# Patient Record
Sex: Female | Born: 1955 | Race: White | Hispanic: No | Smoking: Former smoker
Health system: Southern US, Community
[De-identification: ages and names within clinical notes are randomized; demographics above are authoritative.]

## PROBLEM LIST (undated history)

## (undated) DIAGNOSIS — E119 Type 2 diabetes mellitus without complications: Secondary | ICD-10-CM

## (undated) DIAGNOSIS — J45909 Unspecified asthma, uncomplicated: Secondary | ICD-10-CM

## (undated) DIAGNOSIS — I313 Pericardial effusion (noninflammatory): Secondary | ICD-10-CM

## (undated) DIAGNOSIS — I10 Essential (primary) hypertension: Secondary | ICD-10-CM

## (undated) DIAGNOSIS — I890 Lymphedema, not elsewhere classified: Secondary | ICD-10-CM

## (undated) DIAGNOSIS — Z8719 Personal history of other diseases of the digestive system: Secondary | ICD-10-CM

## (undated) DIAGNOSIS — Z8739 Personal history of other diseases of the musculoskeletal system and connective tissue: Secondary | ICD-10-CM

## (undated) DIAGNOSIS — M797 Fibromyalgia: Secondary | ICD-10-CM

## (undated) DIAGNOSIS — J449 Chronic obstructive pulmonary disease, unspecified: Secondary | ICD-10-CM

## (undated) DIAGNOSIS — I4891 Unspecified atrial fibrillation: Secondary | ICD-10-CM

## (undated) DIAGNOSIS — F329 Major depressive disorder, single episode, unspecified: Secondary | ICD-10-CM

## (undated) DIAGNOSIS — F32A Depression, unspecified: Secondary | ICD-10-CM

## (undated) HISTORY — PX: PARTIAL HYSTERECTOMY: SHX80

## (undated) HISTORY — PX: CHOLECYSTECTOMY: SHX55

## (undated) HISTORY — DX: Unspecified atrial fibrillation: I48.91

## (undated) HISTORY — PX: TOTAL VAGINAL HYSTERECTOMY: SHX2548

## (undated) HISTORY — DX: Personal history of other diseases of the digestive system: Z87.19

## (undated) HISTORY — DX: Unspecified asthma, uncomplicated: J45.909

## (undated) HISTORY — DX: Essential (primary) hypertension: I10

## (undated) HISTORY — PX: ABDOMINAL HYSTERECTOMY: SHX81

## (undated) HISTORY — DX: Personal history of other diseases of the musculoskeletal system and connective tissue: Z87.39

## (undated) HISTORY — DX: Pericardial effusion (noninflammatory): I31.3

## (undated) HISTORY — PX: NISSEN FUNDOPLICATION: SHX2091

---

## 2004-09-23 ENCOUNTER — Inpatient Hospital Stay (HOSPITAL_COMMUNITY): Admission: RE | Admit: 2004-09-23 | Discharge: 2004-09-26 | Payer: Self-pay | Admitting: Gynecology

## 2008-05-01 HISTORY — PX: CARDIAC CATHETERIZATION: SHX172

## 2008-05-01 HISTORY — PX: NASAL SINUS SURGERY: SHX719

## 2009-03-12 ENCOUNTER — Ambulatory Visit: Payer: Self-pay | Admitting: Cardiology

## 2009-12-14 ENCOUNTER — Ambulatory Visit: Payer: Self-pay | Admitting: Internal Medicine

## 2010-02-28 ENCOUNTER — Ambulatory Visit: Payer: Self-pay | Admitting: Gastroenterology

## 2010-03-03 LAB — PATHOLOGY REPORT

## 2010-06-30 ENCOUNTER — Ambulatory Visit: Payer: Self-pay | Admitting: Internal Medicine

## 2011-01-13 ENCOUNTER — Ambulatory Visit: Payer: Self-pay | Admitting: Internal Medicine

## 2011-10-30 DIAGNOSIS — I313 Pericardial effusion (noninflammatory): Secondary | ICD-10-CM

## 2011-10-30 HISTORY — DX: Pericardial effusion (noninflammatory): I31.3

## 2011-11-07 ENCOUNTER — Inpatient Hospital Stay: Payer: Self-pay

## 2011-11-07 DIAGNOSIS — R079 Chest pain, unspecified: Secondary | ICD-10-CM

## 2011-11-07 DIAGNOSIS — I319 Disease of pericardium, unspecified: Secondary | ICD-10-CM

## 2011-11-07 LAB — BASIC METABOLIC PANEL
Calcium, Total: 8.8 mg/dL (ref 8.5–10.1)
Chloride: 102 mmol/L (ref 98–107)
Co2: 29 mmol/L (ref 21–32)
EGFR (Non-African Amer.): >60
Osmolality: 280 (ref 275–301)
Potassium: 3.2 mmol/L — ABNORMAL LOW (ref 3.5–5.1)
Sodium: 139 mmol/L (ref 136–145)

## 2011-11-07 LAB — CK TOTAL AND CKMB (NOT AT ARMC)
CK, Total: 93 U/L (ref 21–215)
CK, Total: 53 U/L (ref 21–215)
CK-MB: <0.5 ng/mL — ABNORMAL LOW (ref 0.5–3.6)

## 2011-11-07 LAB — CBC
HGB: 11.8 g/dL — ABNORMAL LOW (ref 12.0–16.0)
MCHC: 32.9 g/dL (ref 32.0–36.0)
Platelet: 481 10*3/uL — ABNORMAL HIGH (ref 150–440)
RDW: 14.4 % (ref 11.5–14.5)

## 2011-11-07 LAB — MAGNESIUM: Magnesium: 2 mg/dL

## 2011-11-07 LAB — PRO B NATRIURETIC PEPTIDE: B-Type Natriuretic Peptide: 228 pg/mL — ABNORMAL HIGH (ref 0–125)

## 2011-11-07 LAB — TROPONIN I: Troponin-I: <0.02 ng/mL

## 2011-11-08 LAB — COMPREHENSIVE METABOLIC PANEL
BUN: 16 mg/dL (ref 7–18)
Chloride: 98 mmol/L (ref 98–107)
Co2: 34 mmol/L — ABNORMAL HIGH (ref 21–32)
EGFR (African American): >60
SGOT(AST): 121 U/L — ABNORMAL HIGH (ref 15–37)
SGPT (ALT): 109 U/L — ABNORMAL HIGH
Total Protein: 7.4 g/dL (ref 6.4–8.2)

## 2011-11-08 LAB — CBC WITH DIFFERENTIAL/PLATELET
Basophil #: 0.1 10*3/uL (ref 0.0–0.1)
Eosinophil %: 0 %
HCT: 33.7 % — ABNORMAL LOW (ref 35.0–47.0)
Lymphocyte #: 1.8 10*3/uL (ref 1.0–3.6)
MCV: 86 fL (ref 80–100)
Monocyte %: 10.6 %
Neutrophil #: 8.6 10*3/uL — ABNORMAL HIGH (ref 1.4–6.5)
RDW: 14.5 % (ref 11.5–14.5)
WBC: 11.8 10*3/uL — ABNORMAL HIGH (ref 3.6–11.0)

## 2011-11-08 LAB — CK TOTAL AND CKMB (NOT AT ARMC): CK-MB: <0.5 ng/mL — ABNORMAL LOW (ref 0.5–3.6)

## 2011-11-10 LAB — BASIC METABOLIC PANEL
Anion Gap: 8 (ref 7–16)
BUN: 14 mg/dL (ref 7–18)
Chloride: 102 mmol/L (ref 98–107)
Creatinine: 0.83 mg/dL (ref 0.60–1.30)
EGFR (African American): >60
Glucose: 185 mg/dL — ABNORMAL HIGH (ref 65–99)

## 2011-11-10 LAB — CBC WITH DIFFERENTIAL/PLATELET
Basophil %: 0.1 %
Eosinophil #: 0 10*3/uL (ref 0.0–0.7)
Lymphocyte %: 3.8 %
MCHC: 32.2 g/dL (ref 32.0–36.0)
Neutrophil %: 89 %

## 2011-11-18 ENCOUNTER — Emergency Department: Payer: Self-pay | Admitting: *Deleted

## 2011-11-18 LAB — COMPREHENSIVE METABOLIC PANEL
Albumin: 3.1 g/dL — ABNORMAL LOW (ref 3.4–5.0)
Anion Gap: 13 (ref 7–16)
Bilirubin,Total: 1.2 mg/dL — ABNORMAL HIGH (ref 0.2–1.0)
Calcium, Total: 8.7 mg/dL (ref 8.5–10.1)
Glucose: 183 mg/dL — ABNORMAL HIGH (ref 65–99)
Osmolality: 277 (ref 275–301)
Potassium: 4.7 mmol/L (ref 3.5–5.1)
Sodium: 134 mmol/L — ABNORMAL LOW (ref 136–145)
Total Protein: 6.5 g/dL (ref 6.4–8.2)

## 2011-11-18 LAB — CBC
MCH: 26.2 pg (ref 26.0–34.0)
MCHC: 30.8 g/dL — ABNORMAL LOW (ref 32.0–36.0)
Platelet: 748 10*3/uL — ABNORMAL HIGH (ref 150–440)
RDW: 15 % — ABNORMAL HIGH (ref 11.5–14.5)

## 2011-11-20 ENCOUNTER — Inpatient Hospital Stay: Payer: Self-pay | Admitting: Internal Medicine

## 2011-11-20 LAB — BASIC METABOLIC PANEL
Anion Gap: 9 (ref 7–16)
BUN: 13 mg/dL (ref 7–18)
Chloride: 95 mmol/L — ABNORMAL LOW (ref 98–107)
Creatinine: 0.76 mg/dL (ref 0.60–1.30)
EGFR (African American): >60
Glucose: 87 mg/dL (ref 65–99)

## 2011-11-20 LAB — HEPATIC FUNCTION PANEL A (ARMC)
Alkaline Phosphatase: 180 U/L — ABNORMAL HIGH (ref 50–136)
Bilirubin, Direct: 0.2 mg/dL (ref 0.00–0.20)
Bilirubin,Total: 0.7 mg/dL (ref 0.2–1.0)

## 2011-11-20 LAB — AMYLASE: Amylase: 26 U/L (ref 25–115)

## 2011-11-20 LAB — URINALYSIS, COMPLETE
Bacteria: NONE SEEN
Bilirubin,UR: NEGATIVE
Glucose,UR: NEGATIVE mg/dL (ref 0–75)
Ketone: NEGATIVE
Protein: NEGATIVE
RBC,UR: <1 /HPF (ref 0–5)
WBC UR: <1 /HPF (ref 0–5)

## 2011-11-20 LAB — CBC WITH DIFFERENTIAL/PLATELET
Eosinophil %: 0.3 %
HCT: 37 % (ref 35.0–47.0)
Lymphocyte #: 3.1 10*3/uL (ref 1.0–3.6)
MCH: 26.2 pg (ref 26.0–34.0)
MCV: 87 fL (ref 80–100)
Monocyte #: 3.7 x10 3/mm — ABNORMAL HIGH (ref 0.2–0.9)
Monocyte %: 10.8 %
Neutrophil #: 27.5 10*3/uL — ABNORMAL HIGH (ref 1.4–6.5)
Platelet: 577 10*3/uL — ABNORMAL HIGH (ref 150–440)
RBC: 4.27 10*6/uL (ref 3.80–5.20)
WBC: 34.5 10*3/uL — ABNORMAL HIGH (ref 3.6–11.0)

## 2011-11-20 LAB — LIPASE, BLOOD: Lipase: 68 U/L — ABNORMAL LOW (ref 73–393)

## 2011-11-21 DIAGNOSIS — I319 Disease of pericardium, unspecified: Secondary | ICD-10-CM

## 2011-11-21 DIAGNOSIS — R079 Chest pain, unspecified: Secondary | ICD-10-CM

## 2011-11-21 DIAGNOSIS — R0602 Shortness of breath: Secondary | ICD-10-CM

## 2011-11-21 LAB — CBC WITH DIFFERENTIAL/PLATELET
Basophil #: 0 10*3/uL (ref 0.0–0.1)
Basophil %: 0.1 %
Eosinophil %: 0 %
HCT: 32.9 % — ABNORMAL LOW (ref 35.0–47.0)
HGB: 10.3 g/dL — ABNORMAL LOW (ref 12.0–16.0)
Lymphocyte #: 1.2 10*3/uL (ref 1.0–3.6)
Lymphocyte %: 5.1 %
MCV: 85 fL (ref 80–100)
Monocyte %: 8.7 %
Neutrophil #: 20.7 10*3/uL — ABNORMAL HIGH (ref 1.4–6.5)
RBC: 3.88 10*6/uL (ref 3.80–5.20)
RDW: 14.4 % (ref 11.5–14.5)
WBC: 24 10*3/uL — ABNORMAL HIGH (ref 3.6–11.0)

## 2011-11-21 LAB — COMPREHENSIVE METABOLIC PANEL
Albumin: 2.6 g/dL — ABNORMAL LOW (ref 3.4–5.0)
Anion Gap: 8 (ref 7–16)
BUN: 10 mg/dL (ref 7–18)
Bilirubin,Total: 0.6 mg/dL (ref 0.2–1.0)
Creatinine: 0.8 mg/dL (ref 0.60–1.30)
Glucose: 177 mg/dL — ABNORMAL HIGH (ref 65–99)
Osmolality: 274 (ref 275–301)
Potassium: 3.7 mmol/L (ref 3.5–5.1)
Sodium: 135 mmol/L — ABNORMAL LOW (ref 136–145)
Total Protein: 6.6 g/dL (ref 6.4–8.2)

## 2011-11-21 LAB — TSH: Thyroid Stimulating Horm: 0.249 u[IU]/mL — ABNORMAL LOW

## 2011-11-21 LAB — CLOSTRIDIUM DIFFICILE BY PCR

## 2011-11-22 DIAGNOSIS — I300 Acute nonspecific idiopathic pericarditis: Secondary | ICD-10-CM

## 2011-11-22 LAB — IRON AND TIBC
Iron Bind.Cap.(Total): 228 ug/dL — ABNORMAL LOW (ref 250–450)
Iron Saturation: 28 %
Iron: 64 ug/dL (ref 50–170)
Unbound Iron-Bind.Cap.: 164 ug/dL

## 2011-11-22 LAB — BASIC METABOLIC PANEL
BUN: 11 mg/dL (ref 7–18)
Calcium, Total: 8.6 mg/dL (ref 8.5–10.1)
Chloride: 102 mmol/L (ref 98–107)
Co2: 30 mmol/L (ref 21–32)
EGFR (African American): >60
EGFR (Non-African Amer.): >60
Osmolality: 283 (ref 275–301)
Potassium: 4 mmol/L (ref 3.5–5.1)
Sodium: 140 mmol/L (ref 136–145)

## 2011-11-22 LAB — FERRITIN: Ferritin (ARMC): 647 ng/mL — ABNORMAL HIGH (ref 8–388)

## 2011-11-22 LAB — CBC WITH DIFFERENTIAL/PLATELET
Basophil #: 0 10*3/uL (ref 0.0–0.1)
Basophil %: 0.2 %
Eosinophil %: 0 %
HCT: 31.5 % — ABNORMAL LOW (ref 35.0–47.0)
HGB: 9.9 g/dL — ABNORMAL LOW (ref 12.0–16.0)
Lymphocyte #: 1.1 10*3/uL (ref 1.0–3.6)
MCH: 26.8 pg (ref 26.0–34.0)
MCHC: 31.6 g/dL — ABNORMAL LOW (ref 32.0–36.0)
MCV: 85 fL (ref 80–100)
Monocyte #: 0.9 x10 3/mm (ref 0.2–0.9)
Monocyte %: 4.5 %
Neutrophil #: 17.2 10*3/uL — ABNORMAL HIGH (ref 1.4–6.5)
Neutrophil %: 89.5 %
RBC: 3.71 10*6/uL — ABNORMAL LOW (ref 3.80–5.20)
WBC: 19.2 10*3/uL — ABNORMAL HIGH (ref 3.6–11.0)

## 2011-11-22 LAB — PROTEIN, BODY FLUID: Protein, Body Fluid: 3.1 g/dL

## 2011-11-22 LAB — GLUCOSE, SEROUS FLUID: Glucose, Body Fluid: 155 mg/dL

## 2011-11-23 LAB — CBC WITH DIFFERENTIAL/PLATELET
Basophil #: 0 10*3/uL (ref 0.0–0.1)
Eosinophil #: 0 10*3/uL (ref 0.0–0.7)
Eosinophil %: 0 %
HGB: 10 g/dL — ABNORMAL LOW (ref 12.0–16.0)
Lymphocyte #: 1.2 10*3/uL (ref 1.0–3.6)
Lymphocyte %: 9.8 %
MCH: 27.5 pg (ref 26.0–34.0)
MCHC: 32.3 g/dL (ref 32.0–36.0)
MCV: 85 fL (ref 80–100)
Monocyte #: 0.6 x10 3/mm (ref 0.2–0.9)
Monocyte %: 4.7 %
Neutrophil %: 85.4 %
Platelet: 655 10*3/uL — ABNORMAL HIGH (ref 150–440)
RBC: 3.64 10*6/uL — ABNORMAL LOW (ref 3.80–5.20)
RDW: 15 % — ABNORMAL HIGH (ref 11.5–14.5)

## 2011-11-23 LAB — BASIC METABOLIC PANEL
Calcium, Total: 8.9 mg/dL (ref 8.5–10.1)
Chloride: 100 mmol/L (ref 98–107)
Co2: 27 mmol/L (ref 21–32)
Creatinine: 0.85 mg/dL (ref 0.60–1.30)
EGFR (African American): >60
EGFR (Non-African Amer.): >60
Glucose: 192 mg/dL — ABNORMAL HIGH (ref 65–99)
Potassium: 4.3 mmol/L (ref 3.5–5.1)
Sodium: 138 mmol/L (ref 136–145)

## 2011-11-23 LAB — HEPATIC FUNCTION PANEL A (ARMC)
Albumin: 2.5 g/dL — ABNORMAL LOW (ref 3.4–5.0)
Alkaline Phosphatase: 149 U/L — ABNORMAL HIGH (ref 50–136)
Bilirubin, Direct: <0.1 mg/dL (ref 0.00–0.20)
SGOT(AST): 48 U/L — ABNORMAL HIGH (ref 15–37)
Total Protein: 6.7 g/dL (ref 6.4–8.2)

## 2011-11-24 DIAGNOSIS — I4891 Unspecified atrial fibrillation: Secondary | ICD-10-CM

## 2011-11-24 LAB — CBC WITH DIFFERENTIAL/PLATELET
Basophil #: 0 x10 3/mm 3 (ref 0.0–0.1)
Basophil %: 0.1 %
Eosinophil #: 0 x10 3/mm 3 (ref 0.0–0.7)
Eosinophil %: 0 %
HCT: 32.1 % — ABNORMAL LOW (ref 35.0–47.0)
HGB: 10.7 g/dL — ABNORMAL LOW (ref 12.0–16.0)
Lymphocyte %: 10.9 %
Lymphs Abs: 1.4 x10 3/mm 3 (ref 1.0–3.6)
MCH: 27.5 pg (ref 26.0–34.0)
MCHC: 33.3 g/dL (ref 32.0–36.0)
MCV: 83 fL (ref 80–100)
Monocyte #: 0.9 x10 3/mm (ref 0.2–0.9)
Monocyte %: 6.9 %
Neutrophil #: 10.2 x10 3/mm 3 — ABNORMAL HIGH (ref 1.4–6.5)
Neutrophil %: 82.1 %
Platelet: 709 x10 3/mm 3 — ABNORMAL HIGH (ref 150–440)
RBC: 3.88 X10 6/mm 3 (ref 3.80–5.20)
RDW: 14.6 % — ABNORMAL HIGH (ref 11.5–14.5)
WBC: 12.5 x10 3/mm 3 — ABNORMAL HIGH (ref 3.6–11.0)

## 2011-11-24 LAB — BASIC METABOLIC PANEL
Anion Gap: 10 (ref 7–16)
BUN: 21 mg/dL — ABNORMAL HIGH (ref 7–18)
Chloride: 100 mmol/L (ref 98–107)
Creatinine: 0.86 mg/dL (ref 0.60–1.30)
EGFR (African American): >60
EGFR (Non-African Amer.): >60
Glucose: 161 mg/dL — ABNORMAL HIGH (ref 65–99)
Potassium: 4.2 mmol/L (ref 3.5–5.1)

## 2011-11-24 LAB — PROT IMMUNOELECTROPHORES(ARMC)

## 2011-11-25 DIAGNOSIS — I300 Acute nonspecific idiopathic pericarditis: Secondary | ICD-10-CM

## 2011-11-25 LAB — BASIC METABOLIC PANEL
Anion Gap: 13 (ref 7–16)
Chloride: 99 mmol/L (ref 98–107)
Co2: 27 mmol/L (ref 21–32)
EGFR (Non-African Amer.): >60
Glucose: 212 mg/dL — ABNORMAL HIGH (ref 65–99)
Osmolality: 288 (ref 275–301)
Potassium: 4.2 mmol/L (ref 3.5–5.1)

## 2011-11-25 LAB — CBC WITH DIFFERENTIAL/PLATELET
Basophil #: 0 10*3/uL (ref 0.0–0.1)
Basophil %: 0.1 %
HCT: 34.8 % — ABNORMAL LOW (ref 35.0–47.0)
HGB: 11.8 g/dL — ABNORMAL LOW (ref 12.0–16.0)
Lymphocyte %: 8.1 %
MCH: 28.3 pg (ref 26.0–34.0)
Monocyte %: 2.4 %
Neutrophil #: 13.6 10*3/uL — ABNORMAL HIGH (ref 1.4–6.5)
WBC: 15.3 10*3/uL — ABNORMAL HIGH (ref 3.6–11.0)

## 2011-11-25 LAB — CULTURE, BLOOD (SINGLE)

## 2011-11-26 ENCOUNTER — Ambulatory Visit: Payer: Self-pay | Admitting: Internal Medicine

## 2011-11-26 LAB — URINALYSIS, COMPLETE
Bacteria: NONE SEEN
Bilirubin,UR: NEGATIVE
Blood: NEGATIVE
Glucose,UR: NEGATIVE mg/dL (ref 0–75)
Ketone: NEGATIVE
Ph: 6 (ref 4.5–8.0)
RBC,UR: 1 /HPF (ref 0–5)
Specific Gravity: 1.024 (ref 1.003–1.030)
Squamous Epithelial: 2

## 2011-11-26 LAB — BASIC METABOLIC PANEL
Anion Gap: 11 (ref 7–16)
BUN: 27 mg/dL — ABNORMAL HIGH (ref 7–18)
Calcium, Total: 8.5 mg/dL (ref 8.5–10.1)
Chloride: 101 mmol/L (ref 98–107)
Co2: 27 mmol/L (ref 21–32)
Creatinine: 0.68 mg/dL (ref 0.60–1.30)
Osmolality: 287 (ref 275–301)
Potassium: 4.1 mmol/L (ref 3.5–5.1)

## 2011-11-26 LAB — CBC WITH DIFFERENTIAL/PLATELET
Basophil %: 0.2 %
Eosinophil #: 0 10*3/uL (ref 0.0–0.7)
Eosinophil %: 0 %
HCT: 36.6 % (ref 35.0–47.0)
HGB: 12.1 g/dL (ref 12.0–16.0)
Lymphocyte #: 1.6 10*3/uL (ref 1.0–3.6)
MCH: 27.7 pg (ref 26.0–34.0)
Monocyte #: 0.9 x10 3/mm (ref 0.2–0.9)
Monocyte %: 4.4 %
Neutrophil #: 18 10*3/uL — ABNORMAL HIGH (ref 1.4–6.5)
Neutrophil %: 87.8 %
Platelet: 839 10*3/uL — ABNORMAL HIGH (ref 150–440)
RBC: 4.38 10*6/uL (ref 3.80–5.20)
WBC: 20.5 10*3/uL — ABNORMAL HIGH (ref 3.6–11.0)

## 2011-11-26 LAB — CULTURE, BLOOD (SINGLE)

## 2011-11-27 LAB — CBC WITH DIFFERENTIAL/PLATELET
Basophil #: 0 10*3/uL (ref 0.0–0.1)
Basophil %: 0.1 %
Eosinophil %: 0 %
HCT: 36.2 % (ref 35.0–47.0)
Lymphocyte %: 7.5 %
MCHC: 33 g/dL (ref 32.0–36.0)
Monocyte %: 4.4 %
Neutrophil #: 16.2 10*3/uL — ABNORMAL HIGH (ref 1.4–6.5)
Platelet: 783 10*3/uL — ABNORMAL HIGH (ref 150–440)
RBC: 4.3 10*6/uL (ref 3.80–5.20)
RDW: 15.4 % — ABNORMAL HIGH (ref 11.5–14.5)
WBC: 18.4 10*3/uL — ABNORMAL HIGH (ref 3.6–11.0)

## 2011-11-27 LAB — BASIC METABOLIC PANEL
BUN: 26 mg/dL — ABNORMAL HIGH (ref 7–18)
Calcium, Total: 8.5 mg/dL (ref 8.5–10.1)
Chloride: 97 mmol/L — ABNORMAL LOW (ref 98–107)
Co2: 28 mmol/L (ref 21–32)
EGFR (Non-African Amer.): >60
Osmolality: 283 (ref 275–301)
Potassium: 4 mmol/L (ref 3.5–5.1)

## 2011-11-28 ENCOUNTER — Ambulatory Visit: Payer: Self-pay | Admitting: Internal Medicine

## 2011-11-28 LAB — CBC WITH DIFFERENTIAL/PLATELET
Basophil #: 0 10*3/uL (ref 0.0–0.1)
Basophil %: 0.3 %
Eosinophil %: 0 %
HCT: 35.1 % (ref 35.0–47.0)
Lymphocyte #: 1.1 10*3/uL (ref 1.0–3.6)
Lymphocyte %: 6.7 %
MCV: 84 fL (ref 80–100)
Monocyte %: 5.2 %
Neutrophil #: 13.8 10*3/uL — ABNORMAL HIGH (ref 1.4–6.5)
Neutrophil %: 87.8 %
Platelet: 747 10*3/uL — ABNORMAL HIGH (ref 150–440)
RBC: 4.2 10*6/uL (ref 3.80–5.20)
RDW: 15 % — ABNORMAL HIGH (ref 11.5–14.5)
WBC: 15.8 10*3/uL — ABNORMAL HIGH (ref 3.6–11.0)

## 2011-11-28 LAB — COMPREHENSIVE METABOLIC PANEL
Albumin: 2.6 g/dL — ABNORMAL LOW (ref 3.4–5.0)
Anion Gap: 9 (ref 7–16)
BUN: 28 mg/dL — ABNORMAL HIGH (ref 7–18)
Bilirubin,Total: 0.2 mg/dL (ref 0.2–1.0)
Calcium, Total: 8.7 mg/dL (ref 8.5–10.1)
Chloride: 101 mmol/L (ref 98–107)
Creatinine: 0.94 mg/dL (ref 0.60–1.30)
Glucose: 162 mg/dL — ABNORMAL HIGH (ref 65–99)
Osmolality: 287 (ref 275–301)
Potassium: 4.5 mmol/L (ref 3.5–5.1)
Sodium: 139 mmol/L (ref 136–145)
Total Protein: 6.3 g/dL — ABNORMAL LOW (ref 6.4–8.2)

## 2011-11-30 ENCOUNTER — Ambulatory Visit: Payer: Self-pay | Admitting: Internal Medicine

## 2011-12-01 ENCOUNTER — Encounter: Payer: Self-pay | Admitting: Cardiovascular Disease

## 2011-12-01 LAB — CULTURE, BLOOD (SINGLE)

## 2011-12-05 ENCOUNTER — Other Ambulatory Visit: Payer: Self-pay | Admitting: Cardiology

## 2011-12-05 DIAGNOSIS — R002 Palpitations: Secondary | ICD-10-CM

## 2011-12-07 ENCOUNTER — Ambulatory Visit: Payer: Self-pay | Admitting: Internal Medicine

## 2011-12-07 LAB — CBC CANCER CENTER
Basophil %: 0.8 %
Eosinophil %: 0.1 %
HCT: 41.1 % (ref 35.0–47.0)
HGB: 13.7 g/dL (ref 12.0–16.0)
Lymphocyte %: 9.3 %
MCH: 28.2 pg (ref 26.0–34.0)
Neutrophil #: 16 x10 3/mm — ABNORMAL HIGH (ref 1.4–6.5)
Neutrophil %: 83.4 %
RBC: 4.87 10*6/uL (ref 3.80–5.20)
RDW: 16.2 % — ABNORMAL HIGH (ref 11.5–14.5)

## 2011-12-12 ENCOUNTER — Other Ambulatory Visit: Payer: Self-pay

## 2011-12-12 ENCOUNTER — Other Ambulatory Visit (INDEPENDENT_AMBULATORY_CARE_PROVIDER_SITE_OTHER): Payer: Managed Care, Other (non HMO)

## 2011-12-12 DIAGNOSIS — R002 Palpitations: Secondary | ICD-10-CM

## 2011-12-18 ENCOUNTER — Ambulatory Visit: Payer: Self-pay | Admitting: Rheumatology

## 2011-12-19 ENCOUNTER — Ambulatory Visit (INDEPENDENT_AMBULATORY_CARE_PROVIDER_SITE_OTHER): Payer: Managed Care, Other (non HMO) | Admitting: Cardiovascular Disease

## 2011-12-19 ENCOUNTER — Encounter: Payer: Self-pay | Admitting: Cardiovascular Disease

## 2011-12-19 VITALS — BP 160/82 | HR 87 | Ht 63.0 in | Wt 219.2 lb

## 2011-12-19 DIAGNOSIS — I319 Disease of pericardium, unspecified: Secondary | ICD-10-CM

## 2011-12-19 DIAGNOSIS — I313 Pericardial effusion (noninflammatory): Secondary | ICD-10-CM | POA: Insufficient documentation

## 2011-12-19 DIAGNOSIS — Z01810 Encounter for preprocedural cardiovascular examination: Secondary | ICD-10-CM

## 2011-12-19 DIAGNOSIS — I4891 Unspecified atrial fibrillation: Secondary | ICD-10-CM | POA: Insufficient documentation

## 2011-12-19 NOTE — Patient Instructions (Addendum)
Your echo showed no fluid around the heart.  Follow up as needed.

## 2011-12-19 NOTE — Assessment & Plan Note (Signed)
This was brief in the setting of pericardial effusion. She has been in sinus rhythm. Continue treatment with diltiazem given that her blood pressure is elevated.

## 2011-12-19 NOTE — Progress Notes (Signed)
HPI  This is a 56 year old female who is here today for a followup visit. She was hospitalized last month at [facility] with idiopathic moderate size pericardial effusion. She was treated with ibuprofen. Steroids were added before hospital discharge. The patient was discharged home but returned a few days later with worsening symptoms. She was noted to have new left pleural effusion as well as stable pericardial effusion. She was treated with colchicine in addition to the above. She had one brief episode of atrial fibrillation with rapid ventricular response and converted to normal sinus rhythm with diltiazem. Since hospital discharge, the patient reports resolution of her symptoms. She denies chest pain, dyspnea or palpitations. She underwent an echocardiogram which showed no evidence of pericardial effusion with normal ejection fraction. 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 Her symptoms improved after decreasing the dose of Decadron. This is due to be stopped in 2 days.  Allergies  Allergen Reactions  . Azithromycin   . Codeine     Vomiting  . Demerol (Meperidine)     Skin feels like it is burning.  . Latex     Peeling skin  . Nucynta (Tapentadol)     Nausea  . Prednisone     Skin gets red.  . Tramadol     vomiting     Current Outpatient Prescriptions on File Prior to Visit  Medication Sig Dispense Refill  . albuterol (PROVENTIL) (2.5 MG/3ML) 0.083% nebulizer solution Take 2.5 mg by nebulization every 4 (four) hours as needed.      Marland Kitchen aspirin 81 MG tablet Take 81 mg by mouth daily.      Marland Kitchen diltiazem (CARDIZEM CD) 240 MG 24 hr capsule Take 240 mg by mouth daily.      . fluticasone (FLONASE) 50 MCG/ACT nasal spray Place 2 sprays into the nose daily.      . Fluticasone-Salmeterol (ADVAIR DISKUS) 250-50 MCG/DOSE AEPB Inhale 1 puff into the lungs 2 (two) times daily.      . hydrochlorothiazide (HYDRODIURIL) 25 MG tablet Take 25 mg by mouth daily.      .  montelukast (SINGULAIR) 10 MG tablet Take 10 mg by mouth at bedtime.      . pantoprazole (PROTONIX) 40 MG tablet Take 40 mg by mouth daily.         Past Medical History  Diagnosis Date  . Hypertension   . Hx of gastroesophageal reflux (GERD)   . Reactive airway disease   . History of burning pain in leg   . Asthma   . Atrial fibrillation     Brief, in setting of idiopathic pericardial effusion  . Pericardial effusion 07/13    idiopathic , likely viral     Past Surgical History  Procedure Date  . Nasal sinus surgery 2010  . Cholecystectomy   . Partial hysterectomy   . Nissen fundoplication   . Cardiac catheterization 2010    [facility]  . Total vaginal hysterectomy      History reviewed. No pertinent family history.   History   Social History  . Marital Status: Legally Separated    Spouse Name: N/A    Number of Children: N/A  . Years of Education: N/A   Occupational History  . Not on file.   Social History Main Topics  . Smoking status: Former Smoker -- 1.0 packs/day for 15 years    Types: Cigarettes    Quit date: 10/10/1989  . Smokeless tobacco: Not on file  .  Alcohol Use: No  . Drug Use: No  . Sexually Active:    Other Topics Concern  . Not on file   Social History Narrative  . No narrative on file    PHYSICAL EXAM   BP 160/82  Pulse 87  Ht 5\' 3"  (1.6 m)  Wt 219 lb 4 oz (99.451 kg)  BMI 38.84 kg/m2 Constitutional: She is oriented to person, place, and time. She appears well-developed and well-nourished. No distress.  HENT: No nasal discharge.  Head: Normocephalic and atraumatic.  Eyes: Pupils are equal and round. Right eye exhibits no discharge. Left eye exhibits no discharge.  Neck: Normal range of motion. Neck supple. No JVD present. No thyromegaly present.  Cardiovascular: Normal rate, regular rhythm, normal heart sounds. Exam reveals no gallop and no friction rub. No murmur heard.  Pulmonary/Chest: Effort normal and breath sounds normal. No  stridor. No respiratory distress. She has no wheezes. She has no rales. She exhibits no tenderness.  Abdominal: Soft. Bowel sounds are normal. She exhibits no distension. There is no tenderness. There is no rebound and no guarding.  Musculoskeletal: Normal range of motion. She exhibits no edema and no tenderness.  Neurological: She is alert and oriented to person, place, and time. Coordination normal.  Skin: Skin is warm and dry. No rash noted. She is not diaphoretic. No erythema. No pallor.  Psychiatric: She has a normal mood and affect. Her behavior is normal. Judgment and thought content normal.     EKG: Normal sinus rhythm with pulmonary disease pattern.   ASSESSMENT AND PLAN

## 2011-12-19 NOTE — Assessment & Plan Note (Signed)
This was idiopathic and likely due a viral illness. Repeat echocardiogram showed resolution of pericardial effusion. No further cardiac workup is needed at this time. Follow as needed.

## 2011-12-22 LAB — CBC CANCER CENTER
Basophil #: 0 x10 3/mm (ref 0.0–0.1)
Basophil %: 0.5 %
Eosinophil #: 0.1 x10 3/mm (ref 0.0–0.7)
HGB: 11.7 g/dL — ABNORMAL LOW (ref 12.0–16.0)
Lymphocyte #: 2.5 x10 3/mm (ref 1.0–3.6)
MCH: 27.9 pg (ref 26.0–34.0)
MCHC: 33.2 g/dL (ref 32.0–36.0)
MCV: 84 fL (ref 80–100)
Monocyte #: 0.7 x10 3/mm (ref 0.2–0.9)
Neutrophil #: 7.1 x10 3/mm — ABNORMAL HIGH (ref 1.4–6.5)
RBC: 4.2 10*6/uL (ref 3.80–5.20)
RDW: 17.4 % — ABNORMAL HIGH (ref 11.5–14.5)

## 2011-12-31 ENCOUNTER — Ambulatory Visit: Payer: Self-pay | Admitting: Internal Medicine

## 2012-01-05 LAB — CBC CANCER CENTER
Basophil #: 0.2 x10 3/mm — ABNORMAL HIGH (ref 0.0–0.1)
Eosinophil #: 0.1 x10 3/mm (ref 0.0–0.7)
Eosinophil %: 0.5 %
Lymphocyte #: 4 x10 3/mm — ABNORMAL HIGH (ref 1.0–3.6)
MCH: 26.7 pg (ref 26.0–34.0)
MCHC: 31.8 g/dL — ABNORMAL LOW (ref 32.0–36.0)
MCV: 84 fL (ref 80–100)
Monocyte #: 1.4 x10 3/mm — ABNORMAL HIGH (ref 0.2–0.9)
Neutrophil #: 13.1 x10 3/mm — ABNORMAL HIGH (ref 1.4–6.5)
Platelet: 808 x10 3/mm — ABNORMAL HIGH (ref 150–440)
RDW: 18.3 % — ABNORMAL HIGH (ref 11.5–14.5)

## 2012-01-10 LAB — CBC CANCER CENTER
Basophil #: 0.2 x10 3/mm — ABNORMAL HIGH (ref 0.0–0.1)
Basophil %: 1.1 %
Eosinophil #: 0.1 x10 3/mm (ref 0.0–0.7)
HCT: 32.1 % — ABNORMAL LOW (ref 35.0–47.0)
HGB: 10 g/dL — ABNORMAL LOW (ref 12.0–16.0)
MCH: 26.5 pg (ref 26.0–34.0)
MCHC: 31.2 g/dL — ABNORMAL LOW (ref 32.0–36.0)
Monocyte #: 2 x10 3/mm — ABNORMAL HIGH (ref 0.2–0.9)
Neutrophil #: 11.5 x10 3/mm — ABNORMAL HIGH (ref 1.4–6.5)
Neutrophil %: 65.3 %
RDW: 19.6 % — ABNORMAL HIGH (ref 11.5–14.5)

## 2012-01-16 ENCOUNTER — Ambulatory Visit: Payer: Self-pay | Admitting: Internal Medicine

## 2012-01-17 LAB — CBC CANCER CENTER
Basophil: 1 %
Eosinophil: 1 %
HCT: 29.1 % — ABNORMAL LOW (ref 35.0–47.0)
HGB: 8.9 g/dL — ABNORMAL LOW (ref 12.0–16.0)
Lymphocytes: 20 %
MCV: 84 fL (ref 80–100)
Metamyelocyte: 1 %
Monocytes: 8 %
RDW: 19.5 % — ABNORMAL HIGH (ref 11.5–14.5)
Segmented Neutrophils: 63 %
Variant Lymphocyte: 2 %
WBC: 12.6 x10 3/mm — ABNORMAL HIGH (ref 3.6–11.0)

## 2012-01-17 LAB — RETICULOCYTES
Absolute Retic Count: 0.0719 10*6/uL (ref 0.023–0.096)
Reticulocyte: 2.08 % (ref 0.5–2.2)

## 2012-01-18 LAB — KAPPA/LAMBDA FREE LIGHT CHAINS (ARMC)

## 2012-01-19 ENCOUNTER — Other Ambulatory Visit: Payer: Self-pay | Admitting: Internal Medicine

## 2012-01-19 LAB — CBC CANCER CENTER
Basophil #: 0.2 x10 3/mm — ABNORMAL HIGH (ref 0.0–0.1)
Basophil %: 1.4 %
Eosinophil #: 0.2 x10 3/mm (ref 0.0–0.7)
Eosinophil %: 1.5 %
HCT: 29.9 % — ABNORMAL LOW (ref 35.0–47.0)
HGB: 9.4 g/dL — ABNORMAL LOW (ref 12.0–16.0)
Lymphocyte %: 22.6 %
MCH: 26.4 pg (ref 26.0–34.0)
Monocyte %: 8.7 %
Neutrophil #: 8.6 x10 3/mm — ABNORMAL HIGH (ref 1.4–6.5)
Neutrophil %: 65.8 %
Platelet: 919 x10 3/mm — ABNORMAL HIGH (ref 150–440)
RBC: 3.57 10*6/uL — ABNORMAL LOW (ref 3.80–5.20)
WBC: 13 x10 3/mm — ABNORMAL HIGH (ref 3.6–11.0)

## 2012-01-19 LAB — HEPATIC FUNCTION PANEL A (ARMC)
Alkaline Phosphatase: 110 U/L (ref 50–136)
Bilirubin,Total: 0.2 mg/dL (ref 0.2–1.0)
SGOT(AST): 15 U/L (ref 15–37)
SGPT (ALT): 18 U/L (ref 12–78)
Total Protein: 7.2 g/dL (ref 6.4–8.2)

## 2012-01-19 LAB — LIPASE, BLOOD: Lipase: 110 U/L (ref 73–393)

## 2012-01-19 LAB — AMYLASE: Amylase: 28 U/L (ref 25–115)

## 2012-01-24 LAB — CBC CANCER CENTER
Basophil #: 0.1 x10 3/mm (ref 0.0–0.1)
Basophil %: 0.7 %
Eosinophil %: 1 %
HGB: 9.4 g/dL — ABNORMAL LOW (ref 12.0–16.0)
Lymphocyte %: 14.5 %
Monocyte %: 9.2 %
Neutrophil #: 13.8 x10 3/mm — ABNORMAL HIGH (ref 1.4–6.5)
Neutrophil %: 74.6 %
RBC: 3.65 10*6/uL — ABNORMAL LOW (ref 3.80–5.20)
WBC: 18.5 x10 3/mm — ABNORMAL HIGH (ref 3.6–11.0)

## 2012-01-29 LAB — CBC CANCER CENTER
Eosinophil #: 0.3 x10 3/mm (ref 0.0–0.7)
Eosinophil %: 2.9 %
MCH: 26.2 pg (ref 26.0–34.0)
Monocyte #: 0.9 x10 3/mm (ref 0.2–0.9)
Monocyte %: 8.4 %
Neutrophil %: 62.5 %
Platelet: 772 x10 3/mm — ABNORMAL HIGH (ref 150–440)
RBC: 3.55 10*6/uL — ABNORMAL LOW (ref 3.80–5.20)
WBC: 11.3 x10 3/mm — ABNORMAL HIGH (ref 3.6–11.0)

## 2012-01-30 ENCOUNTER — Ambulatory Visit: Payer: Self-pay | Admitting: Internal Medicine

## 2012-02-09 LAB — CBC CANCER CENTER
Basophil #: 0.2 x10 3/mm — ABNORMAL HIGH (ref 0.0–0.1)
Basophil %: 1.2 %
Eosinophil #: 0.5 x10 3/mm (ref 0.0–0.7)
Eosinophil %: 4 %
HCT: 31.6 % — ABNORMAL LOW (ref 35.0–47.0)
HGB: 9.8 g/dL — ABNORMAL LOW (ref 12.0–16.0)
Lymphocyte #: 2.6 x10 3/mm (ref 1.0–3.6)
Lymphocyte %: 19.4 %
MCHC: 30.9 g/dL — ABNORMAL LOW (ref 32.0–36.0)
MCV: 84 fL (ref 80–100)
Monocyte #: 1.1 x10 3/mm — ABNORMAL HIGH (ref 0.2–0.9)
Monocyte %: 8.6 %
Neutrophil #: 8.8 x10 3/mm — ABNORMAL HIGH (ref 1.4–6.5)

## 2012-02-09 LAB — OCCULT BLOOD X 1 CARD TO LAB, STOOL: Occult Blood, Feces: NEGATIVE

## 2012-02-19 LAB — CBC CANCER CENTER
Basophil #: 0.1 x10 3/mm (ref 0.0–0.1)
Eosinophil #: 0.8 x10 3/mm — ABNORMAL HIGH (ref 0.0–0.7)
Eosinophil %: 7.3 %
Lymphocyte #: 2.7 x10 3/mm (ref 1.0–3.6)
Lymphocyte %: 25.1 %
MCH: 25.7 pg — ABNORMAL LOW (ref 26.0–34.0)
MCV: 83 fL (ref 80–100)
Monocyte #: 0.9 x10 3/mm (ref 0.2–0.9)
Platelet: 578 x10 3/mm — ABNORMAL HIGH (ref 150–440)
RBC: 3.93 10*6/uL (ref 3.80–5.20)
RDW: 19.4 % — ABNORMAL HIGH (ref 11.5–14.5)
WBC: 10.7 x10 3/mm (ref 3.6–11.0)

## 2012-02-19 LAB — RETICULOCYTES
Absolute Retic Count: 0.0689 10*6/uL (ref 0.023–0.096)
Reticulocyte: 1.75 % (ref 0.5–2.2)

## 2012-02-20 LAB — KAPPA/LAMBDA FREE LIGHT CHAINS (ARMC)

## 2012-03-01 ENCOUNTER — Ambulatory Visit: Payer: Self-pay | Admitting: Internal Medicine

## 2012-03-11 LAB — CBC CANCER CENTER
Basophil #: 0.1 x10 3/mm (ref 0.0–0.1)
Basophil %: 1.2 %
Eosinophil #: 0.2 x10 3/mm (ref 0.0–0.7)
HCT: 33.6 % — ABNORMAL LOW (ref 35.0–47.0)
HGB: 10.6 g/dL — ABNORMAL LOW (ref 12.0–16.0)
Lymphocyte %: 29 %
MCH: 26.5 pg (ref 26.0–34.0)
MCHC: 31.5 g/dL — ABNORMAL LOW (ref 32.0–36.0)
MCV: 84 fL (ref 80–100)
Monocyte #: 0.8 x10 3/mm (ref 0.2–0.9)
Neutrophil #: 5.4 x10 3/mm (ref 1.4–6.5)
RBC: 3.99 10*6/uL (ref 3.80–5.20)
WBC: 9.2 x10 3/mm (ref 3.6–11.0)

## 2012-03-31 ENCOUNTER — Ambulatory Visit: Payer: Self-pay | Admitting: Internal Medicine

## 2012-04-04 LAB — CBC CANCER CENTER
Basophil %: 1.1 %
Eosinophil #: 0.1 x10 3/mm (ref 0.0–0.7)
HCT: 34.8 % — ABNORMAL LOW (ref 35.0–47.0)
Lymphocyte %: 34.2 %
MCH: 26.5 pg (ref 26.0–34.0)
MCHC: 32.4 g/dL (ref 32.0–36.0)
Monocyte #: 0.7 x10 3/mm (ref 0.2–0.9)
Monocyte %: 10 %
Neutrophil %: 52.7 %
Platelet: 424 x10 3/mm (ref 150–440)
RBC: 4.26 10*6/uL (ref 3.80–5.20)

## 2012-05-01 ENCOUNTER — Ambulatory Visit: Payer: Self-pay | Admitting: Internal Medicine

## 2012-05-31 LAB — CBC CANCER CENTER
Basophil #: 0.1 x10 3/mm (ref 0.0–0.1)
Eosinophil #: 0.4 x10 3/mm (ref 0.0–0.7)
HCT: 35.6 % (ref 35.0–47.0)
HGB: 11.8 g/dL — ABNORMAL LOW (ref 12.0–16.0)
MCH: 26.5 pg (ref 26.0–34.0)
MCHC: 33.2 g/dL (ref 32.0–36.0)
Monocyte %: 12.1 %
Neutrophil #: 5.2 x10 3/mm (ref 1.4–6.5)
Neutrophil %: 58.7 %
Platelet: 415 x10 3/mm (ref 150–440)
WBC: 8.8 x10 3/mm (ref 3.6–11.0)

## 2012-06-01 ENCOUNTER — Ambulatory Visit: Payer: Self-pay | Admitting: Internal Medicine

## 2012-06-28 ENCOUNTER — Emergency Department: Payer: Self-pay | Admitting: Emergency Medicine

## 2012-06-28 LAB — APTT: Activated PTT: 34.8 secs (ref 23.6–35.9)

## 2012-06-28 LAB — COMPREHENSIVE METABOLIC PANEL
Albumin: 4.2 g/dL (ref 3.4–5.0)
Alkaline Phosphatase: 149 U/L — ABNORMAL HIGH (ref 50–136)
Anion Gap: 5 — ABNORMAL LOW (ref 7–16)
BUN: 13 mg/dL (ref 7–18)
Bilirubin,Total: 0.5 mg/dL (ref 0.2–1.0)
Chloride: 105 mmol/L (ref 98–107)
Co2: 30 mmol/L (ref 21–32)
Creatinine: 0.82 mg/dL (ref 0.60–1.30)
Glucose: 86 mg/dL (ref 65–99)
Potassium: 3.6 mmol/L (ref 3.5–5.1)
SGPT (ALT): 43 U/L (ref 12–78)
Sodium: 140 mmol/L (ref 136–145)
Total Protein: 8 g/dL (ref 6.4–8.2)

## 2012-06-28 LAB — CBC CANCER CENTER
Basophil #: 0.1 x10 3/mm (ref 0.0–0.1)
Eosinophil #: 0.2 x10 3/mm (ref 0.0–0.7)
HCT: 35 % (ref 35.0–47.0)
HGB: 11.6 g/dL — ABNORMAL LOW (ref 12.0–16.0)
Lymphocyte #: 2.2 x10 3/mm (ref 1.0–3.6)
MCHC: 33.1 g/dL (ref 32.0–36.0)
Neutrophil #: 3.1 x10 3/mm (ref 1.4–6.5)
Neutrophil %: 51.2 %
Platelet: 359 x10 3/mm (ref 150–440)

## 2012-06-28 LAB — CK TOTAL AND CKMB (NOT AT ARMC)
CK, Total: 68 U/L (ref 21–215)
CK-MB: <0.5 ng/mL — ABNORMAL LOW (ref 0.5–3.6)

## 2012-06-28 LAB — TROPONIN I: Troponin-I: <0.02 ng/mL

## 2012-06-29 ENCOUNTER — Ambulatory Visit: Payer: Self-pay | Admitting: Internal Medicine

## 2012-08-02 ENCOUNTER — Ambulatory Visit: Payer: Self-pay | Admitting: Internal Medicine

## 2012-08-21 LAB — CBC CANCER CENTER
Eosinophil %: 2.9 %
HGB: 12 g/dL (ref 12.0–16.0)
Lymphocyte #: 2.6 x10 3/mm (ref 1.0–3.6)
Lymphocyte %: 35.3 %
MCHC: 32.3 g/dL (ref 32.0–36.0)
MCV: 83 fL (ref 80–100)
Neutrophil #: 3.9 x10 3/mm (ref 1.4–6.5)
RBC: 4.48 10*6/uL (ref 3.80–5.20)
RDW: 15.5 % — ABNORMAL HIGH (ref 11.5–14.5)

## 2012-08-29 ENCOUNTER — Ambulatory Visit: Payer: Self-pay | Admitting: Internal Medicine

## 2012-09-12 ENCOUNTER — Ambulatory Visit: Payer: Self-pay | Admitting: Gastroenterology

## 2012-09-13 LAB — PATHOLOGY REPORT

## 2012-10-15 ENCOUNTER — Encounter: Payer: Self-pay | Admitting: Rheumatology

## 2012-10-29 ENCOUNTER — Encounter: Payer: Self-pay | Admitting: Rheumatology

## 2012-11-29 ENCOUNTER — Encounter: Payer: Self-pay | Admitting: Rheumatology

## 2012-12-11 ENCOUNTER — Encounter: Payer: Self-pay | Admitting: Rheumatology

## 2012-12-30 ENCOUNTER — Encounter: Payer: Self-pay | Admitting: Rheumatology

## 2013-01-23 ENCOUNTER — Emergency Department: Payer: Self-pay | Admitting: Emergency Medicine

## 2013-01-23 LAB — URINALYSIS, COMPLETE
Bacteria: NONE SEEN
Ketone: NEGATIVE
Leukocyte Esterase: NEGATIVE
Ph: 7 (ref 4.5–8.0)
Protein: NEGATIVE
RBC,UR: NONE SEEN /HPF (ref 0–5)
Squamous Epithelial: <1
WBC UR: <1 /HPF (ref 0–5)

## 2013-01-23 LAB — COMPREHENSIVE METABOLIC PANEL
Alkaline Phosphatase: 158 U/L — ABNORMAL HIGH (ref 50–136)
Anion Gap: 4 — ABNORMAL LOW (ref 7–16)
BUN: 14 mg/dL (ref 7–18)
Calcium, Total: 9.1 mg/dL (ref 8.5–10.1)
EGFR (African American): >60
EGFR (Non-African Amer.): >60
Glucose: 132 mg/dL — ABNORMAL HIGH (ref 65–99)
Osmolality: 280 (ref 275–301)
Potassium: 3.8 mmol/L (ref 3.5–5.1)

## 2013-01-23 LAB — CBC
HCT: 37.3 % (ref 35.0–47.0)
MCH: 27.9 pg (ref 26.0–34.0)
MCHC: 33.6 g/dL (ref 32.0–36.0)
MCV: 83 fL (ref 80–100)
Platelet: 426 10*3/uL (ref 150–440)
RDW: 15.8 % — ABNORMAL HIGH (ref 11.5–14.5)
WBC: 8.2 10*3/uL (ref 3.6–11.0)

## 2013-01-23 LAB — CK TOTAL AND CKMB (NOT AT ARMC)
CK, Total: 73 U/L (ref 21–215)
CK-MB: 0.5 ng/mL (ref 0.5–3.6)

## 2013-01-29 ENCOUNTER — Encounter: Payer: Self-pay | Admitting: Rheumatology

## 2013-01-30 ENCOUNTER — Ambulatory Visit: Payer: Self-pay | Admitting: Internal Medicine

## 2013-03-01 ENCOUNTER — Encounter: Payer: Self-pay | Admitting: Rheumatology

## 2013-03-04 ENCOUNTER — Ambulatory Visit: Payer: Self-pay | Admitting: Gastroenterology

## 2013-03-31 ENCOUNTER — Encounter: Payer: Self-pay | Admitting: Rheumatology

## 2013-06-27 ENCOUNTER — Telehealth: Payer: Self-pay | Admitting: *Deleted

## 2013-06-27 NOTE — Telephone Encounter (Signed)
Patient called and stated she has a wrist BP cuff and thinks it is reading high. She wanted to know if I thought they were accurate. Patient is no longer seen here and is now seen at [facility]. I reccommended the patient make appt at [facility] to see if its high or use a BP machine at a local pharmacy to see if it is reading the same as the wrist cuff.

## 2013-12-13 IMAGING — RF DG SMALL BOWEL
1 series · 9 of 9 positions shown · non-contrast
Comparison: CT scan 11/20/2011.

FLUOROSCOPY TIME:  0 min and 48 seconds

CLINICAL DATA: Anemia.

EXAM:
SMALL BOWEL SERIES
TECHNIQUE: Following ingestion of thin barium, serial small bowel images were
obtained including spot views of the terminal ileum.

[Series 1: fluoro · 9 of 9 slices shown]
[im 1/9]
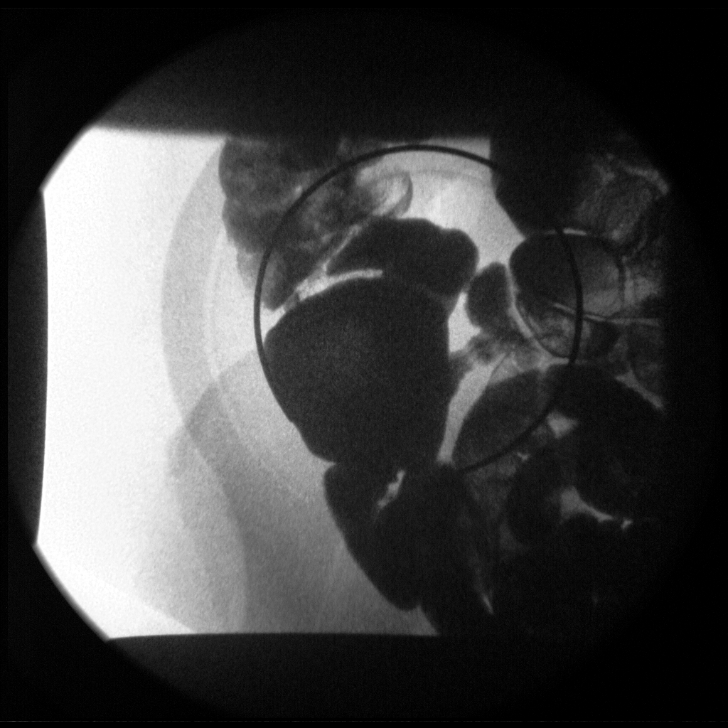
[im 2/9]
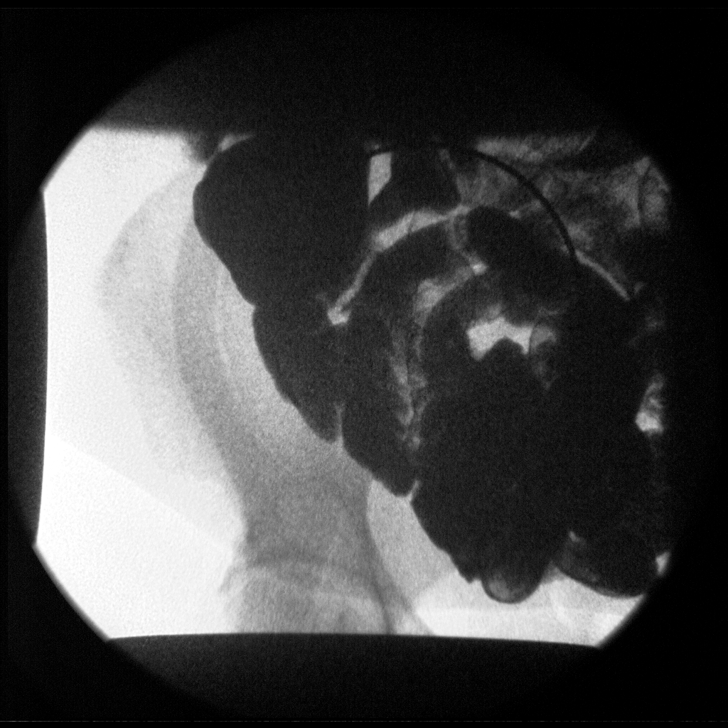
[im 3/9]
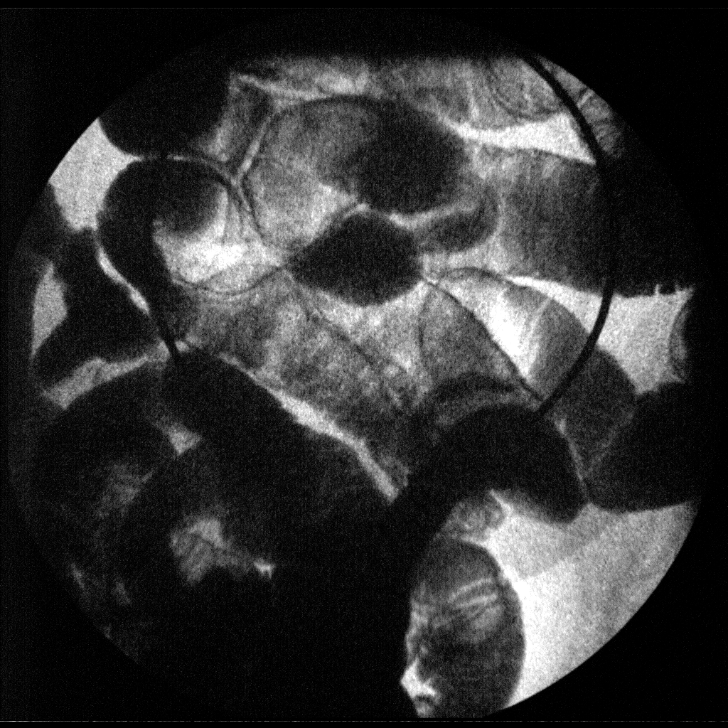
[im 4/9]
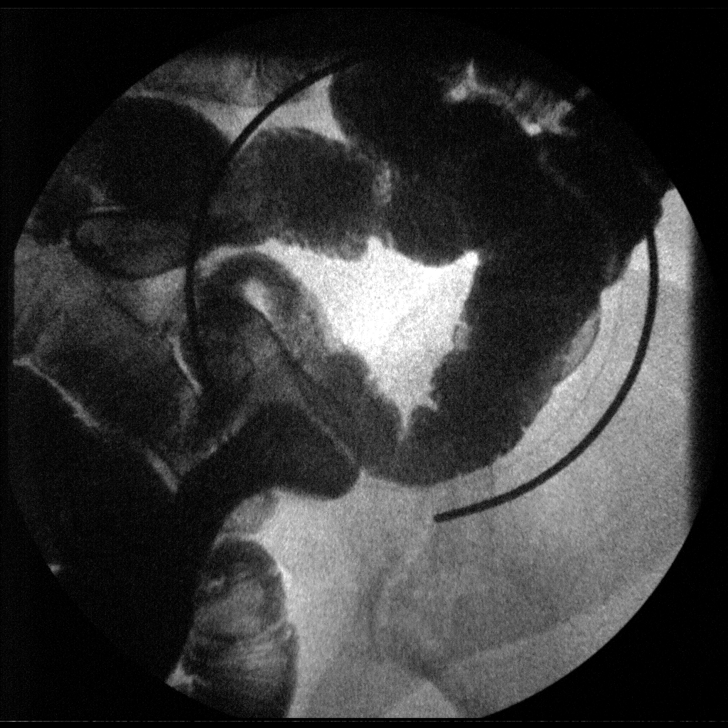
[im 5/9]
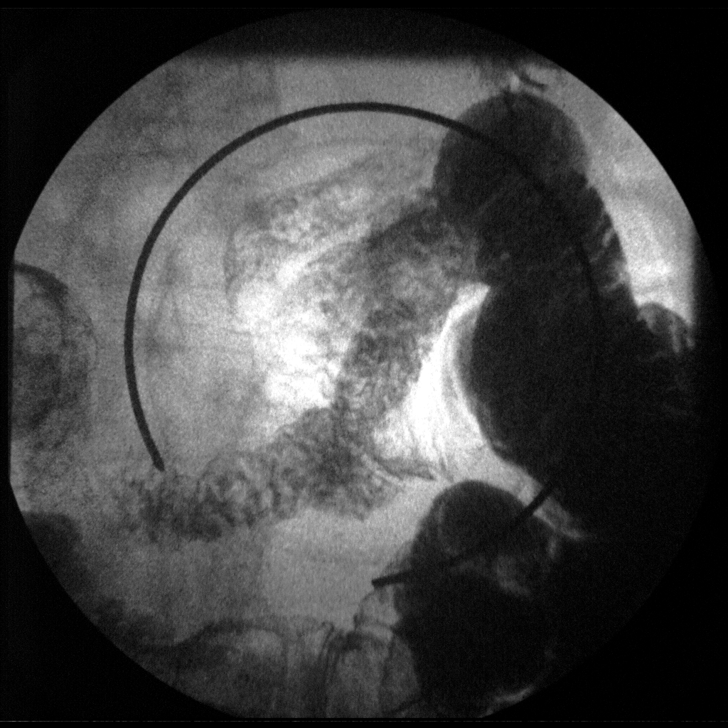
[im 6/9]
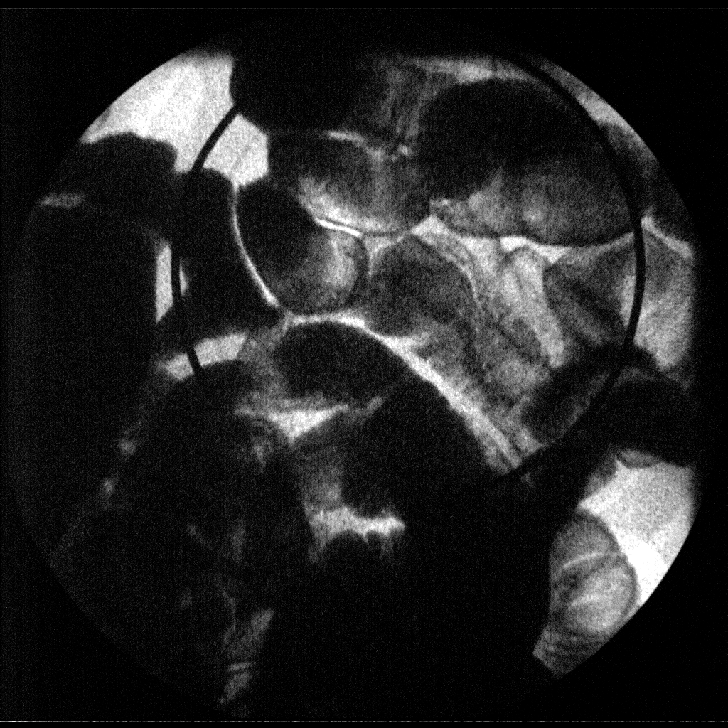
[im 7/9]
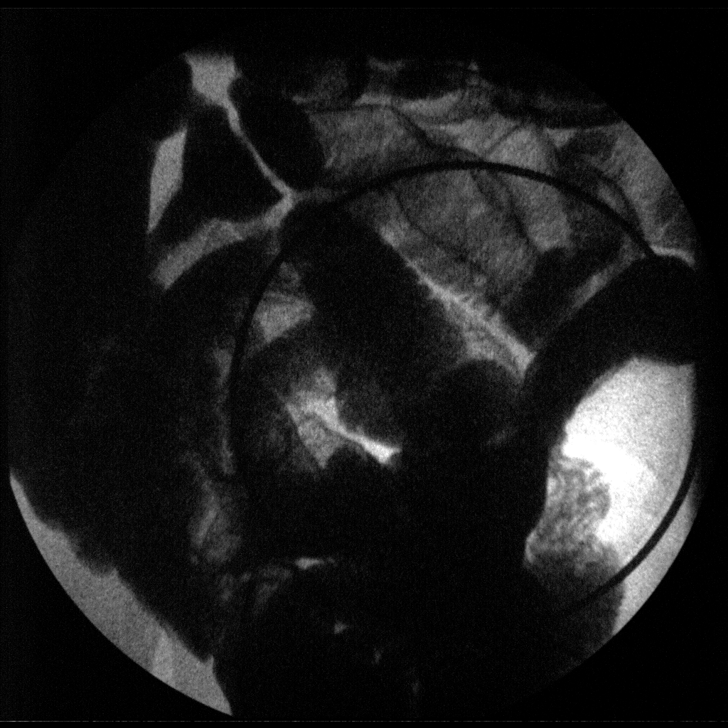
[im 8/9]
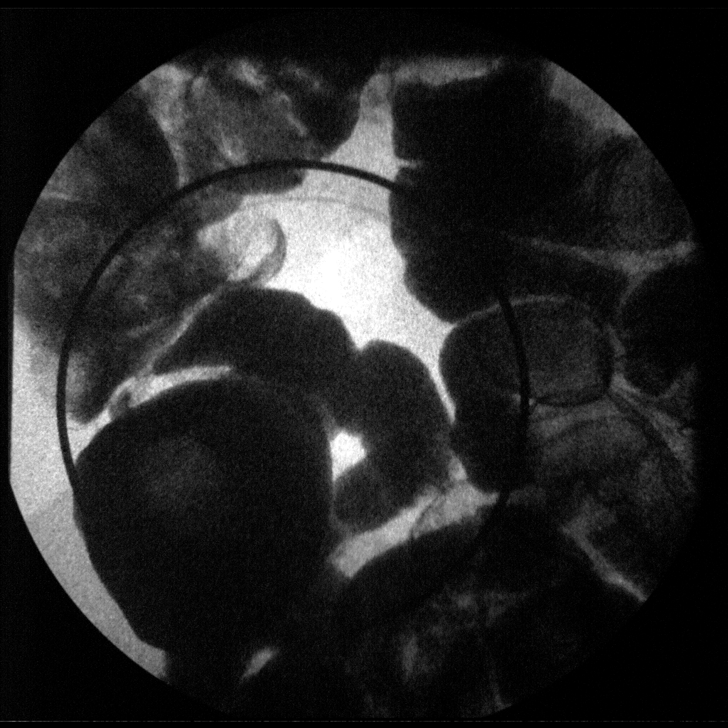
[im 9/9]
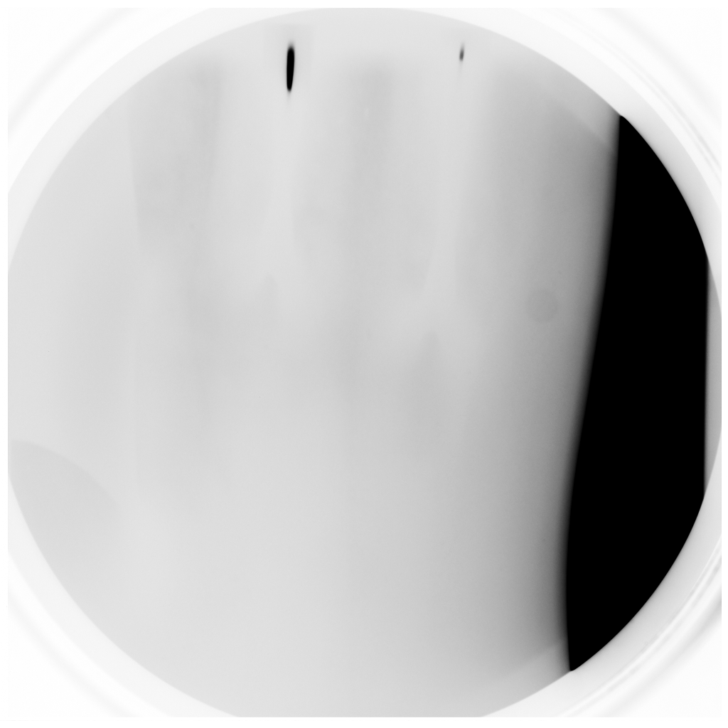

[9 of 9 positions shown; findings below may reference images not displayed]

FINDINGS: Normal small bowel transit time of 1 hr and 30 min. Normal mucosal
full pattern in the jejunum and ileum without intrinsic or extrinsic
lesions. No fixed filling defects. Moderate stool noted in the
colon. The terminal ileum is normal. The small bowel loops were
mobile.
IMPRESSION: Normal small bowel follow-through examination.

## 2014-03-09 ENCOUNTER — Ambulatory Visit: Payer: Self-pay

## 2014-08-18 NOTE — Consult Note (Signed)
PATIENT NAME:  Hailey Phillips, Cammie J MR#:  [id number] DATE OF BIRTH:  August 25, 1955  DATE OF CONSULTATION:  11/26/2011  REFERRING PHYSICIAN:   CONSULTING PHYSICIAN:  Knute Neuobert G. Deseri Loss, MD  HISTORY OF PRESENT ILLNESS: Patient is 59 years old admitted 07/22 with pain from a pericardial effusion in a recent hospitalization and had a partial response to ibuprofen an Profen and Decadron but went home and then returned with nausea and abdominal pain and intermittent headache. On readmission she had leukocytosis of 31,000, hemoglobin 11.6, AST, ALT were slightly elevated. She was admitted. Initial evaluations included restarting steroids and monitoring on telemetry . She had pulmonary evaluation and steroids were restarted.   PAST MEDICAL HISTORY:  1. Reactive airway disease.  2. GI reflux and Nissan fundoplication. 3. Cholesterol. 4. Hypertension. 5. Diabetes. 6. Leg pain apparently worked up by rheumatology as per admitting note.  7. Prior cholecystectomy. 8. Partial hysterectomy.  9. Sinus surgery.  10. Fundoplication.   MEDICATIONS: At the time of admission she was on:  1. Advair inhaler. 2. Hydrochlorothiazide 25 mg daily.  3. Flonase sprays. 4. Aspirin 81 mg daily.  5. ProAir 2 puffs p.r.n.  6. Robaxin 500 mg t.i.d. p.r.n.  7. Singulair 10 mg daily.  8. Neurontin 100 mg daily. 9. Omeprazole 20 mg daily.   ALLERGIES: Codeine and Demerol. Prednisone was reported to make her skin turn red. Also allergic to tramadol, latex, azithromycin and Nucynta.   SOCIAL HISTORY: Was a smoker who quit more than 20 years ago. No alcohol.   FAMILY HISTORY: No malignancy.   PHYSICAL EXAMINATION:  GENERAL: Alert and cooperative in no acute distress. Slight pallor.   HEENT: Sclerae clear.   RESPIRATORY: Decreased breath sounds at the base.   HEART: Regular.   ABDOMEN: Nontender. No palpable mass or organomegaly.   EXTREMITIES: No edema.   NEUROLOGIC: Grossly nonfocal. Alert and oriented. Strength  grossly normal. I did not test her gait.   SKIN: There was no rash.   PSYCHIATRIC: Mood and affect were appropriate.   LABORATORY, DIAGNOSTIC AND RADIOLOGICAL DATA: On admission white count was 31,00, hemoglobin 11.6, platelets 621. Labs waxed and waned during hospitalization. Liver function tests were initially elevated, later were normal. Creatinine was and stayed normal. CT chest and abdomen showed increasing pericardial effusion and a moderate left effusion. The abdomen was unremarkable.   Patient subsequently had consultations and also had a thoracentesis. Pleural cytology was negative but there is some indication of an exudate or effusion. An RA he was positive but the CRP final result was not reported. The white count came down with steroids. The steroids were initially given 4 mg q.12 hours were reduced to 2 mg daily and after two days of the lower dose the white count and platelets are back up again, white count 20,000, hemoglobin holding at 12.1 and the platelets 839,000.   IMPRESSION: The leukocytosis and thrombocytosis likely reactive as there is so much evidence of systemic response or autoimmune disease or possibly infectious etiology or infection has not been identified. Also, there is apparent response to steroids, usually Decadron would make neutrophil count go up, in this case it just brought it down. Patient could always have had or have a baseline myeloproliferative disease or earlier iron deficient polycythemia vera or CML but this is really unlikely. I do not think she has a primary hematologic disorder.   PLAN: I agree with increase in the dose of steroids. I would check serial CBCs. I would consider at a later  time a thorough evaluation for genetic changes with BCR or JAK mutation if the cell count progresses or does not respond to steroids or does not mirror the other findings including the pleural and pericardial effusion. I would follow any opinion from rheumatology or follow  up of RA and CRP and other serologies. There is no indication currently for other scanning or bone marrow examination but the effusion should be watched by at least a chest x-ray. I will follow blood counts and the opinion particular of rheumatology, pulmonology. I would note additionally that a SIEP was negative. No indication at this time for UIEP, I would note also that iron studies have been done and are unremarkable, no other work-up was indicated for the anemia which looks to be part of the whole systemic process.   ____________________________ Knute Neu Lorre Nick, MD rgg:cms D: 11/27/2011 23:35:00 ET T: 11/28/2011 10:46:44 ET JOB#: [id number]  cc: Knute Neu. Lorre Nick, MD, <Dictator> Marin Roberts MD ELECTRONICALLY SIGNED 12/20/2011 12:59

## 2014-08-18 NOTE — Consult Note (Signed)
General Aspect 59 year old Caucasian female with past medical history significant for history of chronic obstructive pulmonary disease (no smoking hx) and history of lower extremity swelling (venous insufficiency), but no history of hypertension, hyperlipidemia or diabetes mellitus who was hospitalized recently for suspected pericarditis and moderate pericardial effusion. She was seen by Dr. Rockey Situ. She was treated initially with NSAIDs without significant improvement so IV steriods were added. She was discharged home but returned yesterday with worsening headache, dyspnea, chest pain and abdominal pain. CT chest showed new left pleural effusion and increase in pericardial effusion. She had leukocytosis on presentation and was started on Abx.   The chest pain is described as intermittent, midsternal chest pain, on the left side of the chest worsening with deep breath.    Present Illness . DRUG ALLERGIES: Demerol, codeine, Tramadol, and prednisone.   FAMILY HISTORY:  mother died at the age of 52. She had COPD and was a smoker. The patient's father had diabetes and hypertension as well as sister had diabetes. Grandparent had a myocardial infarction at an early age. Also CHF in family members.  SOCIAL HISTORY: The patient is divorced. She has three daughters who are 47, 26, and 40 years old. She also has four grandchildren. She used to smoke starting at age of 82 and smoked for approximately 17 years, quit in 1991. She is a caregiver for elderly people.   Physical Exam:   GEN well developed, well nourished, no acute distress, obese    HEENT red conjunctivae    NECK supple  No masses    RESP normal resp effort  clear BS    CARD Regular rate and rhythm  No murmur    ABD denies tenderness  soft    SKIN normal to palpation    NEURO cranial nerves intact, motor/sensory function intact    PSYCH alert, A+O to time, place, person, good insight   Review of Systems:   Subjective/Chief  Complaint Headache, chest pain    General: Fatigue  Weakness    Skin: No Complaints    ENT: No Complaints    Eyes: No Complaints    Neck: No Complaints    Respiratory: No Complaints    Cardiovascular: Chest pain or discomfort  now resolved    Gastrointestinal: No Complaints    Genitourinary: No Complaints    Vascular: No Complaints    Musculoskeletal: No Complaints    Neurologic: headache this Am    Hematologic: No Complaints    Endocrine: No Complaints    Psychiatric: No Complaints    Review of Systems: All other systems were reviewed and found to be negative    Medications/Allergies Reviewed Medications/Allergies reviewed   Lab Results: Hepatic:  23-Jul-13 05:27    Bilirubin, Total 0.6   Alkaline Phosphatase  158   SGPT (ALT)  87 (12-78 NOTE: NEW REFERENCE RANGE 03/24/2011)   SGOT (AST) 23   Total Protein, Serum 6.6   Albumin, Serum  2.6  Cardiology:  23-Jul-13 10:13    Echo Doppler  Interpretation Summary   Left ventricular systolic function is low normal. Ejection Fraction =  50-55%. Moderate pericardial effusion which seems to be stable in  size since previous study. No RV/RA collapse to suggest tamponade  physiology.  Moderate size left pleural effusion.   PatientHeight: 160 cm   PatientWeight: 104 kg   BSA: 2.0 m2  Procedure:   A two-dimensional transthoracic echocardiogram with color flow and  Doppler was performed.  Left Ventricle   Ejection  Fraction = 50-55%.   The transmitral spectral Doppler flow pattern is normal for age.   The left ventricular wall motion is normal.   The left ventricle is normal in size.   There is borderline concentric left ventricular hypertrophy.   Left ventricular systolic function is low normal.  Right Ventricle   The right ventricle is normal size.   There is normal right ventricular wall thickness.   The right ventricular systolic function is normal.  Atria   The left atrial size is normal.   Right  atrial size is normal.   The interatrial septum is intact with no evidence for an atrial  septal defect.  Mitral Valve   The mitral valve is normal in structure and function.   No significant mitral valve stenosis.   There is no mitral regurgitation noted.  Tricuspid Valve   No significant tricuspid stenosis.   There is trace tricuspid regurgitation.   The tricuspid valve is normal.  Aortic Valve   The aortic valve is normal in structure and function.   No hemodynamically significant valvular aortic stenosis.   No aortic regurgitation is present.  Pulmonic Valve   The pulmonic valve is not well visualized.  Great Vessels   The aortic root is normal size.  Pericardium/Pleural   Moderate size left pleural effusion.   Moderate pericardial effusion.  MMode 2D Measurements and Calculations   RVDd: 2.9 cm   IVSd: 1.1 cm   LVIDd: 4.2 cm   LVIDs: 2.4 cm   LVPWd: 1.0 cm   FS: 43 %   EF(Teich): 75 %   Ao root diam: 2.4 cm   LA dimension: 3.7 cm   LVOT diam: 2.1 cm  Doppler Measurements and Calculations   MV E point: 129 cm/sec   MV A point: 95 cm/sec   MV E/A: 1.4    MV dec time: 0.20 sec   Ao V2 max: 130 cm/sec   Ao max PG: 7.0 mmHg   AVA(V,D): 2.6 cm2   LV max PG: 4.0 mmHg   LV V1 max: 99 cm/sec  PA V2 max: 176 cm/sec   PA max PG: 12 mmHg   TR Max vel: 163 cm/sec   TR Max PG: 11 mmHg   RVSP: 16 mmHg   RAP systole: 5.0 mmHg  Reading Physician: Kathlyn Sacramento  Sonographer: Sherrie Sport Interpreting Physician:  Kathlyn Sacramento,  electronically signed on  11-21-2011 12:01:37 Requesting Physician: Kathlyn Sacramento  Routine Chem:  23-Jul-13 05:27    Glucose, Serum  177   BUN 10   Creatinine (comp) 0.80   Sodium, Serum  135   Potassium, Serum 3.7   Chloride, Serum  96   CO2, Serum 31   Calcium (Total), Serum 8.7   Osmolality (calc) 274   eGFR (African American) >60   eGFR (Non-African American) >60 (eGFR values <22m/min/1.73 m2 may be an indication of  chronic kidney disease (CKD). Calculated eGFR is useful in patients with stable renal function. The eGFR calculation will not be reliable in acutely ill patients when serum creatinine is changing rapidly. It is not useful in  patients on dialysis. The eGFR calculation may not be applicable to patients at the low and high extremes of body sizes, pregnant women, and vegetarians.)   Anion Gap 8  Routine Hem:  23-Jul-13 05:27    WBC (CBC)  24.0   RBC (CBC) 3.88   Hemoglobin (CBC)  10.3   Hematocrit (CBC)  32.9   Platelet Count (CBC)  621   MCV 85   MCH 26.5   MCHC  31.3   RDW 14.4   Neutrophil % 86.1   Lymphocyte % 5.1   Monocyte % 8.7   Eosinophil % 0.0   Basophil % 0.1   Neutrophil #  20.7   Lymphocyte # 1.2   Monocyte #  2.1   Eosinophil # 0.0   Basophil # 0.0 (Result(s) reported on 21 Nov 2011 at 06:41AM.)   EKG:   Interpretation EKG shows NSR , no significant ST or T wave changes   Radiology Results: Cardiology:    23-Jul-13 10:13, Echo Doppler   Echo Doppler    Interpretation Summary    Left ventricular systolic function is low normal. Ejection Fraction =   50-55%. Moderate pericardial effusion which seems to be stable in   size since previous study. No RV/RA collapse to suggest tamponade   physiology.  Moderate size left pleural effusion.    PatientHeight: 160 cm    PatientWeight: 104 kg    BSA: 2.0 m2    Procedure:    A two-dimensional transthoracic echocardiogram with color flow and   Doppler was performed.    Left Ventricle    Ejection Fraction = 50-55%.    The transmitral spectral Doppler flow pattern is normal for age.    The left ventricular wall motion is normal.    The left ventricle is normal in size.    There is borderline concentric left ventricular hypertrophy.    Left ventricular systolic function is low normal.    Right Ventricle    The right ventricle is normal size.    There is normal right ventricular wall thickness.    The right  ventricular systolic function is normal.    Atria    The left atrial size is normal.    Right atrial size is normal.    The interatrial septum is intact with no evidence for an atrial   septal defect.    Mitral Valve    The mitral valve is normal in structure and function.    No significant mitral valve stenosis.    There is no mitral regurgitation noted.    Tricuspid Valve    No significant tricuspid stenosis.    There is trace tricuspid regurgitation.    The tricuspid valve is normal.    Aortic Valve    The aortic valve is normal in structure and function.    No hemodynamically significant valvular aortic stenosis.    No aortic regurgitation is present.    Pulmonic Valve    The pulmonic valve is not well visualized.    Great Vessels    The aortic root is normal size.    Pericardium/Pleural    Moderate size left pleural effusion.    Moderate pericardial effusion.    MMode 2D Measurements and Calculations    RVDd: 2.9 cm    IVSd: 1.1 cm    LVIDd: 4.2 cm    LVIDs: 2.4 cm    LVPWd: 1.0 cm    FS: 43 %    EF(Teich): 75 %    Ao root diam: 2.4 cm    LA dimension: 3.7 cm    LVOT diam: 2.1 cm    Doppler Measurements and Calculations    MV E point: 129 cm/sec    MV A point: 95 cm/sec    MV E/A: 1.4     MV dec time: 0.20 sec    Ao V2 max: 130 cm/sec  Ao max PG: 7.0 mmHg    AVA(V,D): 2.6 cm2    LV max PG: 4.0 mmHg    LV V1 max: 99 cm/sec   PA V2 max: 176 cm/sec    PA max PG: 12 mmHg    TR Max vel: 163 cm/sec    TR Max PG: 11 mmHg    RVSP: 16 mmHg    RAP systole: 5.0 mmHg    Reading Physician: Kathlyn Sacramento   Sonographer: Sherrie Sport  Interpreting Physician:  Kathlyn Sacramento,  electronically signed on   11-21-2011 12:01:37  Requesting Physician: Kathlyn Sacramento    Demerol: Other  Codeine: Unknown  Tramadol: Unknown  Prednisone: Unknown  Vital Signs/Nurse's Notes: **Vital Signs.:   23-Jul-13 11:46   Vital Signs Type Routine   Temperature Temperature  (F) 98   Celsius 36.6   Temperature Source oral   Pulse Pulse 93   Respirations Respirations 17   Systolic BP Systolic BP 572   Diastolic BP (mmHg) Diastolic BP (mmHg) 66   Mean BP 88   Pulse Ox % Pulse Ox % 97   Pulse Ox Activity Level  At rest   Oxygen Delivery 2L     Impression 59 year old Caucasian female with persistent pericaridal effusion and new left pleural effusion. Previous cardiac cath in 2010 showed no significant CAD.   1) Pericarditis with moderate size pericardial effusion with new left pleural effusion: Pericardial effussion seems to be stable by size on echo without evidence of tamponade.  The etiology is still not clear but likely idiopathic. However, due to multiple symptoms, will need to rule out systemic connective tissue or autoimmune disease. I will check TSH, ESR, CRP, ANA and RF.  I recommend starting Colchicine in addition to Ibuprofen.  Consider stopping steroids.  If no improvement with above, consider throacentesis for diagnostic purposes as this would be easier than pericardiocentesis.   Electronic Signatures: Kathlyn Sacramento (MD)  (Signed 23-Jul-13 12:40)  Authored: General Aspect/Present Illness, History and Physical Exam, Review of System, Labs, EKG , Radiology, Allergies, Vital Signs/Nurse's Notes, Impression/Plan   Last Updated: 23-Jul-13 12:40 by Kathlyn Sacramento (MD)

## 2014-08-18 NOTE — Consult Note (Signed)
PATIENT NAME:  Hailey Phillips, Hailey Phillips MR#:  [id number] DATE OF BIRTH:  [birth year]  DATE OF CONSULTATION:  11/21/2011  REFERRING PHYSICIAN:  Dr. Einar Crow CONSULTING PHYSICIAN:  Stann Mainland. Sampson Goon, MD  REASON FOR CONSULT: Leukocytosis, pericardial effusion, pleural effusion, elevated liver function tests.  HISTORY OF PRESENT ILLNESS:  This is a very pleasant 59 year old female who was relatively well until approximately two weeks ago when she developed the onset of severe headache on the top of her head on a Monday evening.  She took ibuprofen and went to bed.  However, when she awoke on Tuesday morning she still had a severe headache shooting up from her neck as well as some chest pain, chest pressure/tightness, and arm pain.  She presented to the [facility] and was brought to [facility] Emergency Room where she was found to have pericardial effusion with pain likely from pericarditis.  During that admission she was treated with ibuprofen and dexamethasone and had some clinical improvement.  She was discharged home on a Decadron taper with some continued mild improvement but never back to her baseline.  She states she went back to work three days ago but developed worsening chest pain, heaviness, and dry cough.  She felt very dizzy and felt quite ill and had to leave work.  She also had some nausea and abdominal pain as well.  She presented to the emergency room where she was found to have a markedly elevated white count of 31,000 and an elevated AST and ALT.   She underwent a CT scan of her chest and was found to have continued pericardial effusion and also new onset of left pleural effusion.  She had a CT of her abdomen, which was relatively unremarkable. Due to remarkably elevated white count she was placed on antibiotic and admitted.  Blood cultures were drawn and are pending.    The patient does report continued dry cough which can be hacking at times and makes it  difficult for her to breathe.  She has had some mild diarrhea.  She denies any arthralgias, myalgias, constipation, dysuria, or palpitations.  She reports her headache has resolved as has her neck pain.    PAST MEDICAL HISTORY: 1. Reactive airway disease versus an element of chronic obstructive pulmonary disease. 2. Gastroesophageal reflux disease status post  Nissen fundoplication. 3. Hypertension. 4. Hypercholesterolemia. 5. Diabetes. 6. History of left leg pain with negative work-up by rheumatology.  PAST SURGICAL HISTORY: 1. Cholecystectomy. 2. Partial hysterectomy. 3. Nissen fundoplication. 4. Sinus surgery.  MEDICATIONS: 1. Advair. 2. Hydrochlorothiazide. 3. Flonase. 4. Aspirin. 5. ProAir. 6. Robaxin. 7. Singulair. 8. Neurontin. 9. Omeprazole. 10. Of note, she did finish a Decadron taper within the last week. 11. She has been on no antibiotics.  ALLERGIES:  Codeine causes vomiting.  Demerol makes her skin feel like it is burning.  Prednisone turns her skin red.  Tramadol causes vomiting.  Latex  makes her skin peel off. Azithromycin causes unspecified allergy.  SOCIAL HISTORY:  She is widowed.  She has two grown children. She quit smoking 20 years ago.  She does not drink alcohol.  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  She denies any TB contact.  She reports having a negative PPD one year ago when she was working at [employer].  She has had no international travel.  She has a pet [place] pig, a cat, and three outdoor dogs.  She lives within the city limits  and has no other tick or wildlife exposure.  FAMILY HISTORY:  She has diabetes, coronary artery disease, and asthma in her family.  Of note her daughter is admitted currently with lymphedema and lower extremity cellulitis.  They do live together.  REVIEW OF SYSTEMS:  11 systems are reviewed and are negative except as per history of present illness.  PHYSICAL EXAMINATION: GENERAL:  The patient  is a well-developed, well-nourished  female in no acute distress.  VITAL SIGNS:  Current vital signs include temperature 98, pulse 93, blood pressure 134/66, saturation 97% on two liters. T-max since admission over the last 24 hours was at 98.4.  Initially she was quite tachycardic with a heart rate to 115.   HEENT:  Pupils equal, round, reactive to light and accommodation. Extraocular movements intact.  No scleral icterus.  Conjunctiva are pink.  Oropharynx is clear of any lesions.   NECK:  Supple.  She has no anterior cervical or posterior cervical or supraclavicular lymphadenopathy.  HEART:  Regular with no murmurs.  LUNGS:  She has decreased breath sounds bilaterally and has a very bothersome cough every time she tries to breathe deeply.  There is dullness to percussion over the left lower lobe.  ABDOMEN:  Obese but soft, nondistended.  Minimally tender to palpation diffusely.  She has no rebound or guarding.  EXTREMITIES:  No clubbing, cyanosis, or edema.  SKIN:  No lesions except a bruise from prior IV site.  She has no peripheral stigmata of endocarditis.  NEURO:  She is alert and oriented times three.  Grossly nonfocal neuro exam including cranial nerves.  PSYCH:  Her affect is appropriate.  DATA:  The patient had blood work done in the emergency room prior to admission at which point she had a white blood count of 31,000, hemoglobin of 11.6, and platelets of 621,000.  Of note, her neutrophil count was 27,000.  This morning her white count had decreased to 24 with hemoglobin 10.3 and platelets of 621. Erythrocyte sedimentation rate was 57.    Blood cultures are pending from the day of admission.  She had liver function tests with a total bilirubin of 0.7, alkaline phosphatase 180, ALT 114, AST 41, albumin down at 2.9, BUN 13, creatinine 0.76, sodium 134, potassium 3.4, chloride 95, CO2 30, glucose 87.  She had a lipase of 68 and an amylase of 26.  Urinalysis had no leukocyte esterase or  nitrites.  Blood was negative.  She had a CT of her chest and abdomen with contrast which revealed pericardial effusion which has increased in size with also a moderate size left pleural effusion and left basilar atelectasis.  CT of the abdomen was negative for any acute intraabdominal abnormality.    IMPRESSION:  This is a 59 year old previously healthy woman with a perplexing case of acute pericarditis, now with left pleural effusion and leukocytosis with marked neutrophilia.  It was unclear what the initial etiology of her pericardial effusion was. However, it was presumed to be viral and did seem to respond to oral steroids and ibuprofen.  However, now with marked leukocytosis there is concern for infection.  Differential diagnosis would include a purulent pericarditis, rheumatic condition, TB pericarditis, fungal, or malignancy with malignant effusions.  PLAN: 1. I think it is most important that we get some fluid for analysis.  It would be safer to do a thoracentesis than a pericardiocentesis at this point and I have asked IR to perform a diagnostic thoracentesis and send the fluid for cell  counts, differential, protein, glucose, LDH, routine culture, AFB culture, fungal culture, and cytology. 2. We will send a C. difficile given her occasional loose stools to rule out C. difficile as a cause of her markedly elevated white count. 3. We will order some hematologic labs including rheumatoid arthritis panel and ANA panel. 4. Agree with continuing her on current antibiotics.  She has blood cultures pending.  Thank you for this very interesting consult.  We will be sure to follow along with you.   ____________________________ Stann Mainlandavid P. Sampson GoonFitzgerald, MD dpf:bjt D: 11/21/2011 16:38:12 ET T: 11/21/2011 17:12:58 ET JOB#: [id number]  cc: Stann Mainlandavid P. Sampson GoonFitzgerald, MD, <Dictator> Dalaya Suppa Sampson GoonFITZGERALD MD ELECTRONICALLY SIGNED 11/23/2011 15:49

## 2014-08-18 NOTE — Discharge Summary (Signed)
PATIENT NAME:  Hailey Phillips, Hailey Phillips DATE OF BIRTH:  07/07/1955  DATE OF ADMISSION:  11/20/2011 DATE OF DISCHARGE:  11/28/2011  FINAL DIAGNOSES:  1. 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  2. Atrial fibrillation, resolved.  3. Asthma versus chronic obstructive pulmonary disease.  4. Gastroesophageal reflux disease.  5. Hypertension.  6. Hyperlipidemia.  7. Diabetes.  8. History of chronic leg pain syndrome.   HISTORY AND PHYSICAL: Please see dictated admission history and physical.   SUMMARY OF HOSPITAL COURSE: The patient was admitted with malaise, abdominal discomfort, elevated liver enzymes, finding of pleural and pericardial effusions. Echocardiogram was performed, which revealed preserved LV function, without compromise by the pericardial effusion. Cardiology saw the patient and the patient was placed on colchicine for potential pericarditis. She underwent thoracentesis, with small amount of fluid obtained, which was exudative, but was cytology negative, AFB pending at this time, both reactive mesothelials in the pathology specimen. She was placed on steroids, with initial improvement in her leukocytosis and thrombocytosis. Her course was complicated by development of atrial fibrillation with rapid rate, which required transfer to the intensive care unit. She converted into normal sinus rhythm, medications were adjusted, and it was felt she would not need anticoagulation, particularly because of the pericardial effusion, she is in sinus rhythm now, thought that the atrial fibrillation was likely related to the pericarditis.   As her steroids were weaned, however, she was noted to have worsening leukocytosis and thrombocytosis. A hematology consultation was obtained, and it was felt that this represented some sort of autoimmune phenomenon, although extensive serologies were sent, which were revealing only for  mild elevation of her rheumatoid arthritis titer. Serum protein electrophoresis, ANA were unremarkable. The rheumatoid arthritis quantity in the thoracentesis fluid was noted to be unremarkable. Her liver enzymes normalized over the course of her treatment with steroids as well.   The patient is ambulating and feels quite well, and feels ready to go home, so at this point she will be discharged home in stable condition with close follow-up. We will anticipate a return within two days to recheck her counts. She will follow a 2 gram sodium, 1800 calorie ADA diet. We will anticipate her following up Dr. Lorre NickGittin in the next 1 to 2 weeks as well.   DISCHARGE MEDICATIONS:  1. Advair 250/50 one puff twice a day. 2. Aspirin 81 mg p.o. daily.  3. Hydrochlorothiazide 25 mg p.o. daily.  4. Singulair 10 mg p.o. daily.  5. ProAir 2 puffs four times daily as needed for shortness of breath.  6. Loratadine 10 mg p.o. daily. 7. Colchicine 0.6 mg p.o. twice a day. 8. Pantoprazole 40 mg p.o. twice a day (in place of Nexium and omeprazole) 9. Cardizem CD 240 mg p.o. daily.  10. Dexamethasone 4 mg p.o. twice a day. 11. Levaquin 750 mg daily x5 days to complete the course of antibiotics.  ____________________________ Lynnea FerrierBert Phillips. Klein III, MD bjk:slb D: 11/28/2011 07:53:33 ET T: 11/28/2011 08:06:03 ET JOB#: [id number]  cc: Curtis SitesBert Phillips. Klein III, MD, <Dictator> Marya AmslerMarshall W. Dareen PianoAnderson, MD Knute Neuobert G. Lorre NickGittin, MD Stann Mainlandavid P. Sampson GoonFitzgerald, MD Daniel NonesBERT KLEIN MD ELECTRONICALLY SIGNED 12/01/2011 13:40

## 2014-08-18 NOTE — Consult Note (Signed)
PATIENT NAME:  Hailey Phillips, Hailey Phillips MR#:  [id number] DATE OF BIRTH:  03/30/1956  DATE OF CONSULTATION:  11/21/2011  REFERRING PHYSICIAN:   CONSULTING PHYSICIAN:  Yevonne PaxSaadat A. Westyn Keatley, MD  HISTORY OF PRESENT ILLNESS: 59 year old female who was recently admitted to the hospital with a pericardial effusion. She was seen by cardiology at that time and did not undergo catheterization. She was treated with nonsteroidals and steroids. The patient came back to the hospital now because she was having some malaise and chest pain worsening and some shortness of breath. The patient had a CT scan done which shows increased size of the pericardial effusion and also pleural effusion on the left side. She has had no fevers or chills. She has been having some chest pain.   PAST MEDICAL HISTORY:  1. Reactive airways disease versus chronic obstructive pulmonary disease.  2. Hypertension.  3. Hyperlipidemia.  4. Diabetes.  5. Gastroesophageal reflux disease.  PAST SURGICAL HISTORY: 1. Cholecystectomy.  2. Hysterectomy.    MEDICATIONS: Medications are reviewed on the electronic medical record.   ALLERGIES: Codeine, Demerol, azithromycin.   FAMILY HISTORY: Positive for diabetes and coronary disease.     SOCIAL HISTORY: Positive for tobacco use. She quit at least 20 years ago.   PHYSICAL EXAMINATION: VITAL SIGNS: Temperature 98, pulse 93, respiratory rate 17, blood pressure 134/66, saturations 97%.   NECK: Neck is supple. No JVD. No adenopathy. No thyromegaly.   CHEST: Good air entry bilaterally. No rhonchi or rales. Expansion was equal.   CARDIOVASCULAR: S1, S2 is normal. Regular rhythm. No gallop or rub.   ABDOMEN: Soft, nontender.   EXTREMITIES: Without cyanosis or clubbing. Pulses equal.   NEUROLOGIC: She was awake and alert without any focal deficits.   LABORATORY DATA: The patient's white count was 24,000, hemoglobin 10.3 and hematocrit 32.9. BUN 10, creatinine 0.8, sodium 135. CT as already mentioned.    IMPRESSION:  Acute pericardial effusion secondary to pericarditis and pleural effusion probably secondary to the first reason.   PLAN:  She needs to have a follow-up echo done and cardiology to assess this. Continue with full support and continue with inhalers as you are. I think the pleural effusion is likely coming from the pericardial effusion. I will have to look back and see if she has had a connective tissue work-up done. If not, then this will be reordered. We will continue to monitor and follow along.    ____________________________ Yevonne PaxSaadat A. Karenna Romanoff, MD sak:bjt D: 11/21/2011 12:18:36 ET T: 11/21/2011 13:13:30 ET JOB#: [id number]  cc: Yevonne PaxSaadat A. Medhansh Brinkmeier, MD, <Dictator> Yevonne PaxSAADAT A Marena Witts MD ELECTRONICALLY SIGNED 11/25/2011 13:32

## 2014-08-18 NOTE — Consult Note (Signed)
Brief Consult Note: Diagnosis: pericardial effusion, pleural effusion leukocytosis, diarrhea.   Patient was seen by consultant.   Consult note dictated.   Recommend further assessment or treatment.   Orders entered.   Discussed with Attending MD.   Comments: Brief consult. full note to follow Complicated case with illness for 2-3 weeks.  Pericardial effusion, now pleural effusion, marked leukocystosis Diff dx  Purulent pericarditis, viral pericardial effusion, rheum d/o  Plan  check c diff - L thoracentesis - send for lights criteria, cell ct, diff, cytology, ph, routine, fungal and afb culture - cont current abx.  Electronic Signatures: Dierdre HarnessFitzgerald, Rex Magee Patrick (MD)  (Signed 23-Jul-13 13:02)  Authored: Brief Consult Note   Last Updated: 23-Jul-13 13:02 by Dierdre HarnessFitzgerald, Tavarious Freel Patrick (MD)

## 2014-08-18 NOTE — H&P (Signed)
PATIENT NAME:  Hailey Phillips, Hailey Phillips MR#:  [id number] DATE OF BIRTH:  12-03-1955  DATE OF ADMISSION:  11/20/2011  CHIEF COMPLAINT: Malaise, abdominal pain, chest pain.   HISTORY OF PRESENT ILLNESS: Hailey Phillips is a pleasant 59 year old female with recent [facility] admission for chest pain and pericardial effusion. Hailey Phillips was seen by cardiology during that admission and felt to have pain from the pericardial effusion and felt Hailey Phillips did not need coronary evaluation given the reasonably recent heart catheterization. Hailey Phillips had a partial response to ibuprofen. Dexamethasone was then added to her regimen. Hailey Phillips improved somewhat. Hailey Phillips went home. Hailey Phillips had been feeling more and more fatigued, inability to eat with some nausea and abdominal pain since Hailey Phillips was discharged earlier this month. Hailey Phillips went to the Emergency Room two days ago and left early in the morning the next day after work-up was taking too long for, Hailey Phillips says. Hailey Phillips has had a headache off and on since then. In the Emergency Room Hailey Phillips was noted to have leukocytosis with a white count 31,000, hemoglobin of 11.6, and Hailey Phillips had elevated ALT and AST, 157 and 165. Hailey Phillips therefore presented today for further evaluation. Her troponins remained normal in the Emergency Room notably.   REVIEW OF SYSTEMS: Negative other than what is noted above.   PAST MEDICAL HISTORY:  1. Reactive airway disease versus component of chronic obstructive pulmonary disease.  2. Gastroesophageal reflux disease status post Nissen fundoplication.  3. Hypertension.  4. Hypercholesterolemia.  5. Diabetes.  6. History of leg pain post work-up by rheumatology.   PAST SURGICAL HISTORY:  1. Cholecystectomy.  2. Partial hysterectomy.  3. Fundoplication.  4. Sinus surgery 2010.   MEDICATIONS:  1. Advair 250/50, 1 puff b.i.d.  2. Hydrochlorothiazide 25 mg daily.  3. Flonase spray, two sprays daily.  4. Aspirin 81 mg daily.  5. ProAir 2 puffs every four hours p.r.n.  6. Robaxin  500 mg one t.i.d. p.r.n.  7. Singulair 10 mg daily.  8. Neurontin 100 mg t.i.d.  9. Omeprazole 20 mg daily.   ALLERGIES: Codeine causes vomiting, Demerol makes her feel like her skin is burning, prednisone makes her skin turn red, tramadol causes vomiting, latex makes her "skin peel off", azithromycin unspecified allergy,  Nucynta causes nausea.   SOCIAL HISTORY: Divorced. Hailey Phillips has two children. Quit smoking more than 20 years ago. No alcohol. 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  FAMILY HISTORY: Diabetes, coronary artery disease, and asthma as well as hypertension.   PHYSICAL EXAMINATION:  GENERAL: Malaise noted.  VITAL SIGNS: Pulse 118 and regular, respirations 16, afebrile. Blood pressure is stable.  HEENT:  Head normocephalic, atraumatic. TMs clear. Oropharynx clear.   NECK: No bruits, thyromegaly, adenopathy, or jugular venous distention.  CHEST:  Clear to auscultation and percussion inspection.   HEART: Regular rate and rhythm. No murmurs or gallops.   ABDOMEN: Soft, has some epigastric and right upper quadrant tenderness. Bowel sounds are quiet but present.   EXTREMITIES: No clubbing, cyanosis, or edema.   NEUROLOGIC: Nonfocal.   LABS/STUDIES: As noted above on Saturday.   ASSESSMENT AND PLAN:  Leukocytosis,  marked malaise, abdominal pain, recent pericardial effusion noted: Worrisome overall. I think Hailey Phillips needs to be hospitalized.  Give her IV fluids, start her empirically on antibiotics after blood cultures are obtained. CT of the chest, abdomen, and pelvis given the abdominal pain and recent pericardial effusion. We will get cardiology to see, may need repeat echo as well. Given her shortness of breath and chronic obstructive  pulmonary disease, we will have pulmonary look at her though I don't think Hailey Phillips is having a chronic obstructive pulmonary disease exacerbation per se presently. May need further work-up for headache, although this could be a nonspecific symptom given her general illness.  We will restart steroids in case this pericardial effusion is worsening. We will put her on telemetry, check amylase and lipase as well. Follow up further based on initial evaluation results.  ____________________________ Marya AmslerMarshall W. Dareen PianoAnderson, MD mwa:bjt D: 11/20/2011 12:11:47 ET T: 11/20/2011 12:41:42 ET JOB#: [id number]  cc: Marya AmslerMarshall W. Dareen PianoAnderson, MD, <Dictator> Lauro RegulusMARSHALL W ANDERSON MD ELECTRONICALLY SIGNED 11/20/2011 16:51

## 2014-08-23 NOTE — H&P (Signed)
PATIENT NAME:  Hailey Phillips, OBEID MR#:  [id number] DATE OF BIRTH:  07-Mar-1956  DATE OF ADMISSION:  11/07/2011  PRIMARY CARE PHYSICIAN: Einar Crow, MD  HISTORY OF PRESENT ILLNESS: The patient is a 59 year old Caucasian female with past medical history significant for history of chronic obstructive pulmonary disease and history of lower extremity swelling, but no history of hypertension, hyperlipidemia or diabetes mellitus who presented to the hospital with complaints of chest pain. According to the patient, she was doing well up until Wednesday of last week when she started having chest pain. The pain was described as intermittent, midsternal chest pain, on the left side of the chest as well as on the left side of the chest. However, the pain increased in intensity. Pain was intermittent and was described as pressure-type pain. Today, however, her pain exacerbated and was associated with headache and felt as if someone was sitting on her chest, 10 out of 10 in intensity. It felt as if when she was lying was worse and whenever she sat up it was somewhat better. It was also radiating to the left arm. She denied any lightheadedness or dizziness. However, she admitted to significant shortness of breath. Her pain was also worsening whenever she tried to take a deep breath. She never had similar pains before and never had any headaches or migraines before. Because of significant headache as well as shortness of breath and chest pain, she decided to come to the Emergency Room for further evaluation. In the Emergency Room, she had CT scan of her chest as well as EKG and chest x-ray and hospitalist services were contacted for admission. She also received morphine with some alleviation of her discomfort.   PAST MEDICAL HISTORY:  1. History of gastroesophageal reflux disease. 2. History of partial hysterectomy in the 1990s.  3. History of the ovary surgery in 2004 to 2006.  4. History of chronic obstructive  pulmonary disease, recently diagnosed. 5. History of lower extremity swelling.   PAST SURGICAL HISTORY:  1. History of cardiac catheterization done in 2003. At that time, she had left main insignificant disease, LAD as well as circumflex were normal. RCA was insignificant. Ejection fraction 61%.  2. History of EGD done in October 2011 for Barrett's esophagus followup. Monilial esophagitis was noted as well as normal duodenum was also noted.  3. Nissen fundoplication in 2004. 4. Gallbladder removal in 1990s.  5. Rhinoplasty in 2002.  MEDICATIONS:  1. Nexium 40 mg p.o. daily. 2. Singulair 10 mg p.o. daily.  3. Advair Diskus 100/50 twice daily. 4. ProAir four times daily.  5. Aspirin 81 mg p.o. daily.  6. HCTZ 25 mg p.o. daily.  7. Allegra as needed. 8. Ibuprofen as needed.   DRUG ALLERGIES: Demerol, codeine, Tramadol, and prednisone.   FAMILY HISTORY: The patient's mother died at the age of 40. She had COPD and was a smoker. The patient's father had diabetes and hypertension as well as sister had diabetes. Grandparent had a myocardial infarction at an early age. Also CHF in family members.  SOCIAL HISTORY: The patient is divorced. She has three daughters who are 67, 5, and 11 years old. She also has four grandchildren. She used to smoke starting at age of 69 and smoked for approximately 17 years, quit in 1991. She is a caregiver for elderly people.   REVIEW OF SYSTEMS: Positive for weakness, pains in the chest since Wednesday, also significant chills a week ago and she had to use three blankets for the same,  intermittent sinus congestion, sinus pain, also painful respirations, chronic obstructive pulmonary disease, chest pain for approximately one week intermittent, lower extremity edema which seems to be chronic, intermittent chest palpitations, nausea and epigastric abdominal discomfort on palpation during my evaluation, and foot discomfort. CONSTITUTIONAL: No fevers recently, fatigue,  weight loss or gain. EYES: In regards to eyes, denies any blurry vision, double vision, glaucoma, or cataracts. ENT: Denies any tinnitus, allergies, epistaxis, sinus pain, dentures, or difficulty swallowing. RESPIRATORY: Denies any cough, wheezing, asthma, or COPD. CARDIOVASCULAR: Denies any orthopnea, arrhythmias, palpitations, or syncope. GASTROINTESTINAL: Denies any vomiting, diarrhea, or constipation. GENITOURINARY: Denies any dysuria, hematuria, frequency, or incontinence. ENDOCRINOLOGY: Denies any polydipsia, nocturia, thyroid problems, heat or cold intolerance or thirst. HEMATOLOGIC: Denies any anemia, easy bruising, bleeding, or swollen glands. SKIN: Denies any acne, rashes, lesions, or change in moles. MUSCULOSKELETAL: Denies arthritis, cramps, swelling, or gout. NEUROLOGIC: Denies numbness, epilepsy, or tremor. PSYCHIATRIC: Denies anxiety, insomnia, or tremor.  PHYSICAL EXAMINATION:  VITALS: On arrival to the hospital, the patient's temperature was 97.8, pulse 84, respiration rate 18, blood pressure 173/74, and saturation 97% on room air.   GENERAL: This is a well-developed, well-nourished, mildly obese Caucasian female in mild distress and discomfort lying on the stretcher. She has a wet towel on her forehead.   HEENT: Pupils are equal, round and reactive to light. Extraocular muscles intact. No icterus or conjunctivitis. Has normal hearing. No pharyngeal erythema. Mucosa is moist.   NECK: No masses. Supple and nontender. Thyroid is not enlarged. No adenopathy. No JVD or carotid bruits bilaterally. Full range of motion.  LUNGS: Clear to auscultation in all fields posteriorly. Mildly diminished breath sounds but otherwise no rales, rhonchi, or diminished breath sounds or wheezing. No labored inspirations,  increased effort, dullness to percussion, or overt respiratory distress.   CARDIOVASCULAR: S1 and S2 appreciated, distant. No murmurs, gallops, or rubs were noted. No thrills. Minimal  tenderness to palpation.   EXTREMITIES: 2+ pedal pulses. No lower extremity edema, clubbing, or cyanosis was noted.   ABDOMEN: Soft, tender in the epigastric area. No hepatosplenomegaly or masses were noted.   RECTAL: Deferred.  MUSCULOSKELETAL: Able to move all extremities. No cyanosis, degenerative joint disease, or kyphosis. Gait was not tested.  SKIN: No rashes, lesions, erythema, nodularity, or induration. It was warm and dry to palpation. No adenopathy in the cervical region.   NEUROLOGIC: Cranial nerves II through XII grossly intact. Sensory is intact. No dysarthria or aphasia.   PSYCH: The patient is alert and oriented to time, person, and place. Cooperative. Memory is good.   PSYCHIATRIC: No significant confusion, agitation, or depression.  LABORATORY, DIAGNOSTIC AND RADIOLOGIC DATA: BMP showed glucose of 122,  B-type natriuretic peptide of 228 and potassium 3.2. Otherwise BMP unremarkable. Cardiac enzymes, first set negative. CBC: White blood cell count of 13.6, hemoglobin 11.8, and platelet count 481.   EKG: Sinus rhythm with premature supraventricular complexes, possible left atrial enlargement, nonspecific ST wave abnormality, prolonged QTc to 486 ms. No acute ST-T changes were noted.   Chest x-ray, portable single view: Mild enlargement of cardiac silhouette. No acute cardiopulmonary disease.   CT scan of the chest with IV contrast: No pulmonary embolus. Moderate-sized pericardial effusion.   ASSESSMENT AND PLAN:  1. Chest pain. Admit the patient to the medical floor. Start metoprolol, aspirin, and nitroglycerin. Check cardiac enzymes x3. Get echocardiogram. Get cardiology consultation. This was discussed with Dr. Mariah Milling. We will give the patient Lovenox, one dose now. This will be treatment dose. We  will follow the patient. We will get a stress test in the morning.  2. Pericardial effusion on CT scan. We will continue aspirin as well as Advil for symptoms control.  We will  get an echocardiogram.  3. Malignant hypertension. We will continue metoprolol as well as lisinopril and nitroglycerin topically. We will follow the patient's blood pressure readings and make decisions about advancement of medications as needed.           4. Chronic obstructive pulmonary disease. We will continue Advair viscous as well as albuterol inhaler.   TIME SPENT: 50 minutes.  ____________________________ Katharina Caperima Shamarcus Hoheisel, MD rv:slb D: 11/07/2011 14:14:24 ET T: 11/07/2011 14:57:46 ET JOB#: [id number]  cc: Katharina Caperima Cortez Steelman, MD, <Dictator> Marya AmslerMarshall W. Dareen PianoAnderson, MD Katharina CaperIMA Linday Rhodes MD ELECTRONICALLY SIGNED 11/19/2011 18:38

## 2014-08-23 NOTE — Consult Note (Signed)
General Aspect 59 year old Caucasian female with past medical history significant for history of chronic obstructive pulmonary disease (no smoking hx) and history of lower extremity swelling (venous insufficiency), but no history of hypertension, hyperlipidemia or diabetes mellitus who presented to the hospital with complaints of headache and chest pain. Cardiology was consulted for chest pain and pericardial effusion.  she was doing well up until Wednesday of last week when she started having chest pain. The pain was described as intermittent, midsternal chest pain, on the left side of the chest. This AM she developed a headache that was severe, 10/10. She does not get migraine headaches. She also developed chest pain.  felt as if someone was sitting on her chest. Some  radiating to the left arm. She denied any lightheadedness or dizziness. She also reported significant shortness of breath. Her pain was also worsening whenever she tried to take a deep breath.    In the Emergency Room, she had CT scan of her chest. No PE, pericardial effusion noted, moderate.  She also received morphine with some alleviation of her discomfort.    Present Illness . DRUG ALLERGIES: Demerol, codeine, Tramadol, and prednisone.   FAMILY HISTORY:  mother died at the age of 32. She had COPD and was a smoker. The patient's father had diabetes and hypertension as well as sister had diabetes. Grandparent had a myocardial infarction at an early age. Also CHF in family members.  SOCIAL HISTORY: The patient is divorced. She has three daughters who are 75, 18, and 16 years old. She also has four grandchildren. She used to smoke starting at age of 83 and smoked for approximately 17 years, quit in 1991. She is a caregiver for elderly people.   Physical Exam:   GEN well developed, well nourished, no acute distress, obese    HEENT red conjunctivae    NECK supple  No masses    RESP normal resp effort  clear BS    CARD Regular  rate and rhythm  No murmur    ABD denies tenderness  soft    SKIN normal to palpation    NEURO cranial nerves intact, motor/sensory function intact    PSYCH alert, A+O to time, place, person, good insight   Review of Systems:   Subjective/Chief Complaint Headache, (chest pain resolved)    General: Fatigue  Weakness    Skin: No Complaints    ENT: No Complaints    Eyes: No Complaints    Neck: No Complaints    Respiratory: No Complaints    Cardiovascular: Chest pain or discomfort  now resolved    Gastrointestinal: No Complaints    Genitourinary: No Complaints    Vascular: No Complaints    Musculoskeletal: No Complaints    Neurologic: headache this Am    Hematologic: No Complaints    Endocrine: No Complaints    Psychiatric: No Complaints    Review of Systems: All other systems were reviewed and found to be negative    Medications/Allergies Reviewed Medications/Allergies reviewed     COPD:        Admit Diagnosis:   ACUTE PERICARDIAL EFFUSION: 07-Nov-2011, Active, ACUTE PERICARDIAL EFFUSION      Admit Reason:   Acute pericardial effusion: (420.90) Active, ICD9, Unspecified acute pericarditis  Home Medications: Medication Instructions Status  Advair Diskus 250 mcg-50 mcg inhalation powder 1 puff(s) inhaled 2 times a day Active  aspirin 81 mg oral delayed release tablet 1 tab(s) orally once a day Active  hydrochlorothiazide 25 mg  oral tablet 1 tab(s) orally once a day Active  Nexium 40 mg oral delayed release capsule 1 cap(s) orally every other day alternating with OTC omeprazole Active  omeprazole 20 mg oral delayed release tablet 1 tab(s) orally every other day alternating with Nexium Active  montelukast 10 mg oral tablet 1 tab(s) orally once a day (in the evening) Active  ProAir HFA 90 mcg/inh inhalation aerosol 2 puff(s) inhaled 4 times a day, As Needed- for Shortness of Breath  Active  loratadine 10 mg oral tablet 1 tab(s) orally once a day Active   estradiol 1 mg oral tablet 1 tab(s) orally once a day Active   Lab Results:  Routine Chem:  09-Jul-13 09:11    B-Type Natriuretic Peptide ([facility])  228 (Result(s) reported on 07 Nov 2011 at 11:43AM.)   Glucose, Serum  122   BUN 17   Creatinine (comp) 0.96   Sodium, Serum 139   Potassium, Serum  3.2   Chloride, Serum 102   CO2, Serum 29   Calcium (Total), Serum 8.8   Anion Gap 8   Osmolality (calc) 280   eGFR (African American) >60   eGFR (Non-African American) >60 (eGFR values <38m/min/1.73 m2 may be an indication of chronic kidney disease (CKD). Calculated eGFR is useful in patients with stable renal function. The eGFR calculation will not be reliable in acutely ill patients when serum creatinine is changing rapidly. It is not useful in  patients on dialysis. The eGFR calculation may not be applicable to patients at the low and high extremes of body sizes, pregnant women, and vegetarians.)  Cardiac:  09-Jul-13 09:11    CK, Total 93   CPK-MB, Serum  < 0.5 (Result(s) reported on 07 Nov 2011 at 11:00AM.)   Troponin I < 0.02 (0.00-0.05 0.05 ng/mL or less: NEGATIVE  Repeat testing in 3-6 hrs  if clinically indicated. >0.05 ng/mL: POTENTIAL  MYOCARDIAL INJURY. Repeat  testing in 3-6 hrs if  clinically indicated. NOTE: An increase or decrease  of 30% or more on serial  testing suggests a  clinically important change)    16:28    CK, Total 53   CPK-MB, Serum  < 0.5 (Result(s) reported on 07 Nov 2011 at 05:23PM.)   Troponin I < 0.02 (0.00-0.05 0.05 ng/mL or less: NEGATIVE  Repeat testing in 3-6 hrs  if clinically indicated. >0.05 ng/mL: POTENTIAL  MYOCARDIAL INJURY. Repeat  testing in 3-6 hrs if  clinically indicated. NOTE: An increase or decrease  of 30% or more on serial  testing suggests a  clinically important change)  Routine Hem:  09-Jul-13 09:11    WBC (CBC)  13.6   RBC (CBC) 4.22   Hemoglobin (CBC)  11.8   Hematocrit (CBC) 35.9   Platelet Count (CBC)   481 (Result(s) reported on 07 Nov 2011 at 10:51AM.)   MCV 85   MCH 28.0   MCHC 32.9   RDW 14.4   EKG:   Interpretation EKG shows NSR with rate 88 bpm, no significant ST or T wave changes    Demerol: Other  Codeine: Unknown  Tramadol: Unknown  Prednisone: Unknown  Vital Signs/Nurse's Notes: **Vital Signs.:   09-Jul-13 14:57   Temperature Temperature (F) 98   Temperature Source oral   Pulse Pulse 72   Pulse source if not from Vital Sign Device per cardiac monitor   Respirations Respirations 20   Systolic BP Systolic BP 1546  Diastolic BP (mmHg) Diastolic BP (mmHg) 81   Mean BP 100  BP Source  if not from Vital Sign Device doppler   Pulse Ox % Pulse Ox % 95   Pulse Ox Activity Level  At rest   Oxygen Delivery Room Air/ 21 %     Impression 59 year old Caucasian female with past medical history significant for history of chronic obstructive pulmonary disease (no smoking hx) and history of lower extremity swelling (venous insufficiency), but no history of hypertension, hyperlipidemia or diabetes mellitus who presented to the hospital with complaints of headache and chest pain.  1) Chest pain Previous cardiac cath in 2010 shopwing no significant CAD. Cardiac enz negative x 2 Pericardial effusion, moderate with no hemodynamic compromise --Possible pericarditis, would treat with NSAIDS (high dose IBP) --No need for stress test given recent cardiac cath with no disease.  2) Headache possible migraine, no prior hx Responding to pain medication If sx eprsist, consider CT head.  3) COPD asthma/gerd, obesity no smoking hx on inhalers  4) HTN: well contriolled on HCTZ   Electronic Signatures: Ida Rogue (MD)  (Signed 09-Jul-13 19:52)  Authored: General Aspect/Present Illness, History and Physical Exam, Review of System, Past Medical History, Health Issues, Home Medications, Labs, EKG , Allergies, Vital Signs/Nurse's Notes, Impression/Plan   Last Updated: 09-Jul-13  19:52 by Ida Rogue (MD)

## 2014-08-23 NOTE — Discharge Summary (Signed)
PATIENT NAME:  Hailey Phillips, [name] MR#:  [id number] DATE OF BIRTH:  [birth year]  DATE OF ADMISSION:  11/07/2011 DATE OF DISCHARGE:  11/11/2011  PRIMARY CARE PHYSICIAN: Dr. Einar CrowMarshall Anderson   DISCHARGE DIAGNOSES: 1. Pericardial effusion.  2. Hypertension. 3. Headache.  4. Anemia.  5. Hypotension. 6. Chronic obstructive pulmonary disease.  HISTORY OF PRESENT ILLNESS: Please see the admission history and physical by Dr. Katharina Caperima Vaickute from 11/07/2011. In brief, this is a 59 year old woman history of chronic obstructive pulmonary disease but no history of hyperlipidemia or diabetes who came in with several days of chest pain. She was found to have a pericardial effusion on CT scan but no pulmonary embolus. Her cardiac enzymes were normal.   HOSPITAL COURSE: Patient was admitted and seen by cardiology who felt this could likely be a viral pericarditis. She had an echocardiogram done which showed moderate circumferential pericardial effusion but no hemodynamic compromise. She also had an ejection fraction of 50% to 55%. It was recommended she be treated with NSAIDs. This was done, however, her chest pain symptoms persisted so Decadron was added at 6 mg twice a day and she improved.  She also had headache and this was treated with Percocet as well as Flexeril with some improvement. Her chronic obstructive pulmonary disease was treated with her continuing outpatient medications and was stable.   DISCHARGE PHYSICAL: Patient is afebrile. Vital signs are stable. She is alert and oriented x3. Heart is regular. Lungs are clear. Abdomen soft, nontender, nondistended and there is no edema.   DISCHARGE MEDICATIONS: Patient will be discharged home on her outpatient medications including: 1. Hydrochlorothiazide 25 mg once a day. 2. Nexium 40 mg once a day. 3. Singulair 10 mg once a day. 4. Advair twice a day. 5. ProAir four times a day as needed. 6. Aspirin 81 mg a day.  7. Allegra as needed.  NEW  MEDICATIONS: She will also be placed on new medications of:  1. Decadron 6 mg twice a day for three days followed by 4 mg a day for four days to be tapered following follow up.  2. Ibuprofen 200 mg take 2 tablets q.8 hours for the next five days. 3. Percocet 5/325, 1 tablet p.r.n. pain q.8 hours. 4. Flexeril 10 mg 1 tablet p.r.n. spasm and pain q.8 hours.   DISCHARGE DIET: Low salt.   DISCHARGE ACTIVITY: As tolerated.   DISCHARGE FOLLOW UP: Patient will follow up with Dr. Gaynell FaceMarshall within one weeks' time. She is instructed to call to be seen earlier for recurrent chest pain, shortness of breath, recurrent headache or dizziness or other concerning signs.   ____________________________ Stann Mainlandavid P. Sampson GoonFitzgerald, MD dpf:cms D: 11/11/2011 10:39:51 ET T: 11/11/2011 10:44:11 ET JOB#: [id number]  cc: Stann Mainlandavid P. Sampson GoonFitzgerald, MD, <Dictator>  DAVID Sampson GoonFITZGERALD MD ELECTRONICALLY SIGNED 11/23/2011 15:16

## 2015-02-28 ENCOUNTER — Emergency Department: Payer: Managed Care, Other (non HMO)

## 2015-02-28 ENCOUNTER — Encounter: Payer: Self-pay | Admitting: Emergency Medicine

## 2015-02-28 ENCOUNTER — Inpatient Hospital Stay
Admission: EM | Admit: 2015-02-28 | Discharge: 2015-03-01 | DRG: 190 | Disposition: A | Discharge status: Home / Self Care | Payer: Managed Care, Other (non HMO) | Attending: Internal Medicine | Admitting: Internal Medicine

## 2015-02-28 DIAGNOSIS — Z7951 Long term (current) use of inhaled steroids: Secondary | ICD-10-CM

## 2015-02-28 DIAGNOSIS — I959 Hypotension, unspecified: Secondary | ICD-10-CM

## 2015-02-28 DIAGNOSIS — E876 Hypokalemia: Secondary | ICD-10-CM | POA: Diagnosis present

## 2015-02-28 DIAGNOSIS — F32A Depression, unspecified: Secondary | ICD-10-CM

## 2015-02-28 DIAGNOSIS — D649 Anemia, unspecified: Secondary | ICD-10-CM

## 2015-02-28 DIAGNOSIS — Z888 Allergy status to other drugs, medicaments and biological substances status: Secondary | ICD-10-CM

## 2015-02-28 DIAGNOSIS — J440 Chronic obstructive pulmonary disease with acute lower respiratory infection: Principal | ICD-10-CM | POA: Diagnosis present

## 2015-02-28 DIAGNOSIS — D72829 Elevated white blood cell count, unspecified: Secondary | ICD-10-CM

## 2015-02-28 DIAGNOSIS — Z7982 Long term (current) use of aspirin: Secondary | ICD-10-CM

## 2015-02-28 DIAGNOSIS — J441 Chronic obstructive pulmonary disease with (acute) exacerbation: Secondary | ICD-10-CM | POA: Insufficient documentation

## 2015-02-28 DIAGNOSIS — Z8249 Family history of ischemic heart disease and other diseases of the circulatory system: Secondary | ICD-10-CM

## 2015-02-28 DIAGNOSIS — E119 Type 2 diabetes mellitus without complications: Secondary | ICD-10-CM

## 2015-02-28 DIAGNOSIS — J189 Pneumonia, unspecified organism: Secondary | ICD-10-CM | POA: Diagnosis present

## 2015-02-28 DIAGNOSIS — M797 Fibromyalgia: Secondary | ICD-10-CM | POA: Diagnosis present

## 2015-02-28 DIAGNOSIS — Z79899 Other long term (current) drug therapy: Secondary | ICD-10-CM

## 2015-02-28 DIAGNOSIS — I4891 Unspecified atrial fibrillation: Secondary | ICD-10-CM | POA: Diagnosis present

## 2015-02-28 DIAGNOSIS — A419 Sepsis, unspecified organism: Secondary | ICD-10-CM

## 2015-02-28 DIAGNOSIS — K219 Gastro-esophageal reflux disease without esophagitis: Secondary | ICD-10-CM | POA: Diagnosis present

## 2015-02-28 DIAGNOSIS — F329 Major depressive disorder, single episode, unspecified: Secondary | ICD-10-CM

## 2015-02-28 DIAGNOSIS — I10 Essential (primary) hypertension: Secondary | ICD-10-CM

## 2015-02-28 DIAGNOSIS — Z87891 Personal history of nicotine dependence: Secondary | ICD-10-CM

## 2015-02-28 HISTORY — DX: Fibromyalgia: M79.7

## 2015-02-28 HISTORY — DX: Lymphedema, not elsewhere classified: I89.0

## 2015-02-28 HISTORY — DX: Chronic obstructive pulmonary disease, unspecified: J44.9

## 2015-02-28 HISTORY — DX: Major depressive disorder, single episode, unspecified: F32.9

## 2015-02-28 HISTORY — DX: Depression, unspecified: F32.A

## 2015-02-28 HISTORY — DX: Type 2 diabetes mellitus without complications: E11.9

## 2015-02-28 LAB — CBC WITH DIFFERENTIAL/PLATELET
BASOS ABS: 0.1 10*3/uL (ref 0–0.1)
BASOS PCT: 1 %
Eosinophils Absolute: 0.1 10*3/uL (ref 0–0.7)
Eosinophils Relative: 1 %
HEMATOCRIT: 33 % — AB (ref 35.0–47.0)
HEMOGLOBIN: 11 g/dL — AB (ref 12.0–16.0)
Lymphocytes Relative: 21 %
Lymphs Abs: 2.8 10*3/uL (ref 1.0–3.6)
MCH: 27.7 pg (ref 26.0–34.0)
MCHC: 33.2 g/dL (ref 32.0–36.0)
MCV: 83.5 fL (ref 80.0–100.0)
Monocytes Absolute: 1.4 10*3/uL — ABNORMAL HIGH (ref 0.2–0.9)
Monocytes Relative: 10 %
NEUTROS ABS: 9.1 10*3/uL — AB (ref 1.4–6.5)
NEUTROS PCT: 67 %
Platelets: 516 10*3/uL — ABNORMAL HIGH (ref 150–440)
RBC: 3.95 MIL/uL (ref 3.80–5.20)
RDW: 14 % (ref 11.5–14.5)
WBC: 13.5 10*3/uL — AB (ref 3.6–11.0)

## 2015-02-28 LAB — COMPREHENSIVE METABOLIC PANEL
ALBUMIN: 3.6 g/dL (ref 3.5–5.0)
ALT: 17 U/L (ref 14–54)
ANION GAP: 10 (ref 5–15)
AST: 20 U/L (ref 15–41)
Alkaline Phosphatase: 113 U/L (ref 38–126)
BUN: 12 mg/dL (ref 6–20)
CHLORIDE: 95 mmol/L — AB (ref 101–111)
CO2: 32 mmol/L (ref 22–32)
CREATININE: 0.86 mg/dL (ref 0.44–1.00)
Calcium: 9 mg/dL (ref 8.9–10.3)
Glucose, Bld: 152 mg/dL — ABNORMAL HIGH (ref 65–99)
Potassium: 3.4 mmol/L — ABNORMAL LOW (ref 3.5–5.1)
SODIUM: 137 mmol/L (ref 135–145)
Total Bilirubin: 0.5 mg/dL (ref 0.3–1.2)
Total Protein: 7.4 g/dL (ref 6.5–8.1)

## 2015-02-28 LAB — GLUCOSE, CAPILLARY
GLUCOSE-CAPILLARY: 150 mg/dL — AB (ref 65–99)
GLUCOSE-CAPILLARY: 120 mg/dL — AB (ref 65–99)
GLUCOSE-CAPILLARY: 105 mg/dL — AB (ref 65–99)
Glucose-Capillary: 93 mg/dL (ref 65–99)

## 2015-02-28 LAB — TROPONIN I

## 2015-02-28 MED ORDER — ALUM & MAG HYDROXIDE-SIMETH 200-200-20 MG/5ML PO SUSP: 30.0000 mL | Freq: Four times a day (QID) | ORAL | Status: DC | PRN

## 2015-02-28 MED ORDER — LOSARTAN POTASSIUM-HCTZ 50-12.5 MG PO TABS: 1.0000 | ORAL_TABLET | Freq: Every day | ORAL | Status: DC

## 2015-02-28 MED ORDER — CYCLOBENZAPRINE HCL 10 MG PO TABS
10.0000 mg | ORAL_TABLET | Freq: Three times a day (TID) | ORAL | Status: DC | PRN
  Administered 2015-02-28 – 2015-03-01 (×4): 10 mg via ORAL
  Filled 2015-02-28 (×4): qty 1

## 2015-02-28 MED ORDER — CEFTRIAXONE SODIUM 1 G IJ SOLR
1.0000 g | INTRAMUSCULAR | Status: DC
  Filled 2015-02-28: qty 10

## 2015-02-28 MED ORDER — IPRATROPIUM-ALBUTEROL 0.5-2.5 (3) MG/3ML IN SOLN
3.0000 mL | RESPIRATORY_TRACT | Status: DC
  Administered 2015-02-28 – 2015-03-01 (×3): 3 mL via RESPIRATORY_TRACT
  Filled 2015-02-28 (×4): qty 3

## 2015-02-28 MED ORDER — INSULIN ASPART 100 UNIT/ML ~~LOC~~ SOLN: 0.0000 [IU] | Freq: Every day | SUBCUTANEOUS | Status: DC

## 2015-02-28 MED ORDER — LOSARTAN POTASSIUM 50 MG PO TABS
50.0000 mg | ORAL_TABLET | Freq: Every day | ORAL | Status: DC
  Administered 2015-02-28: 50 mg via ORAL
  Filled 2015-02-28 (×2): qty 1

## 2015-02-28 MED ORDER — PNEUMOCOCCAL VAC POLYVALENT 25 MCG/0.5ML IJ INJ
0.5000 mL | INJECTION | INTRAMUSCULAR | Status: AC
  Administered 2015-03-01: 0.5 mL via INTRAMUSCULAR
  Filled 2015-02-28: qty 0.5

## 2015-02-28 MED ORDER — ASPIRIN 81 MG PO CHEW
81.0000 mg | CHEWABLE_TABLET | Freq: Every day | ORAL | Status: DC
  Administered 2015-02-28 – 2015-03-01 (×2): 81 mg via ORAL
  Filled 2015-02-28 (×2): qty 1

## 2015-02-28 MED ORDER — ACETAMINOPHEN 325 MG PO TABS
650.0000 mg | ORAL_TABLET | Freq: Four times a day (QID) | ORAL | Status: DC | PRN
Start: 2015-02-28 — End: 2015-03-01
  Administered 2015-02-28: 650 mg via ORAL
  Filled 2015-02-28: qty 2

## 2015-02-28 MED ORDER — PANTOPRAZOLE SODIUM 40 MG PO TBEC
40.0000 mg | DELAYED_RELEASE_TABLET | Freq: Every day | ORAL | Status: DC
  Administered 2015-02-28 – 2015-03-01 (×2): 40 mg via ORAL
  Filled 2015-02-28 (×2): qty 1

## 2015-02-28 MED ORDER — HYDROCHLOROTHIAZIDE 12.5 MG PO CAPS
12.5000 mg | ORAL_CAPSULE | Freq: Every day | ORAL | Status: DC
  Administered 2015-02-28 (×2): 12.5 mg via ORAL
  Filled 2015-02-28 (×2): qty 1

## 2015-02-28 MED ORDER — ENOXAPARIN SODIUM 40 MG/0.4ML ~~LOC~~ SOLN
40.0000 mg | SUBCUTANEOUS | Status: DC
  Administered 2015-02-28: 40 mg via SUBCUTANEOUS
  Filled 2015-02-28: qty 0.4

## 2015-02-28 MED ORDER — LORATADINE 10 MG PO TABS
10.0000 mg | ORAL_TABLET | Freq: Every day | ORAL | Status: DC
  Administered 2015-02-28 – 2015-03-01 (×2): 10 mg via ORAL
  Filled 2015-02-28 (×2): qty 1

## 2015-02-28 MED ORDER — SENNOSIDES-DOCUSATE SODIUM 8.6-50 MG PO TABS: 1.0000 | ORAL_TABLET | Freq: Every evening | ORAL | Status: DC | PRN

## 2015-02-28 MED ORDER — PROMETHAZINE HCL 25 MG PO TABS: 12.5000 mg | ORAL_TABLET | Freq: Four times a day (QID) | ORAL | Status: DC | PRN

## 2015-02-28 MED ORDER — IPRATROPIUM-ALBUTEROL 0.5-2.5 (3) MG/3ML IN SOLN: 3.0000 mL | Freq: Four times a day (QID) | RESPIRATORY_TRACT | Status: DC | PRN

## 2015-02-28 MED ORDER — FUROSEMIDE 20 MG PO TABS
20.0000 mg | ORAL_TABLET | Freq: Every morning | ORAL | Status: DC
  Administered 2015-02-28 – 2015-03-01 (×2): 20 mg via ORAL
  Filled 2015-02-28 (×2): qty 1

## 2015-02-28 MED ORDER — DEXTROSE 5 % IV SOLN
2.0000 g | Freq: Once | INTRAVENOUS | Status: AC
  Administered 2015-02-28: 2 g via INTRAVENOUS
  Filled 2015-02-28 (×2): qty 2

## 2015-02-28 MED ORDER — DILTIAZEM HCL ER COATED BEADS 240 MG PO CP24
240.0000 mg | ORAL_CAPSULE | Freq: Every day | ORAL | Status: DC
  Filled 2015-02-28: qty 1

## 2015-02-28 MED ORDER — ROSUVASTATIN CALCIUM 10 MG PO TABS
10.0000 mg | ORAL_TABLET | Freq: Every day | ORAL | Status: DC
  Administered 2015-03-01: 10 mg via ORAL
  Filled 2015-02-28 (×2): qty 1

## 2015-02-28 MED ORDER — GABAPENTIN 300 MG PO CAPS
300.0000 mg | ORAL_CAPSULE | Freq: Three times a day (TID) | ORAL | Status: DC
  Administered 2015-02-28 – 2015-03-01 (×4): 300 mg via ORAL
  Filled 2015-02-28 (×4): qty 1

## 2015-02-28 MED ORDER — IPRATROPIUM-ALBUTEROL 0.5-2.5 (3) MG/3ML IN SOLN
9.0000 mL | Freq: Once | RESPIRATORY_TRACT | Status: AC
  Administered 2015-02-28: 9 mL via RESPIRATORY_TRACT
  Filled 2015-02-28: qty 9

## 2015-02-28 MED ORDER — GUAIFENESIN ER 600 MG PO TB12
600.0000 mg | ORAL_TABLET | Freq: Two times a day (BID) | ORAL | Status: DC
  Administered 2015-02-28 – 2015-03-01 (×3): 600 mg via ORAL
  Filled 2015-02-28 (×3): qty 1

## 2015-02-28 MED ORDER — BUDESONIDE-FORMOTEROL FUMARATE 80-4.5 MCG/ACT IN AERO
2.0000 | INHALATION_SPRAY | Freq: Two times a day (BID) | RESPIRATORY_TRACT | Status: DC
  Administered 2015-02-28 – 2015-03-01 (×3): 2 via RESPIRATORY_TRACT
  Filled 2015-02-28: qty 6.9

## 2015-02-28 MED ORDER — DEXTROSE 5 % IV SOLN
100.0000 mg | Freq: Two times a day (BID) | INTRAVENOUS | Status: DC
  Administered 2015-02-28 – 2015-03-01 (×2): 100 mg via INTRAVENOUS
  Filled 2015-02-28 (×4): qty 100

## 2015-02-28 MED ORDER — DOXYCYCLINE HYCLATE 100 MG IV SOLR
100.0000 mg | Freq: Once | INTRAVENOUS | Status: AC
  Administered 2015-02-28: 100 mg via INTRAVENOUS
  Filled 2015-02-28: qty 100

## 2015-02-28 MED ORDER — ACETAMINOPHEN 650 MG RE SUPP
650.0000 mg | Freq: Four times a day (QID) | RECTAL | Status: DC | PRN
Start: 2015-02-28 — End: 2015-03-01

## 2015-02-28 MED ORDER — INSULIN ASPART 100 UNIT/ML ~~LOC~~ SOLN
0.0000 [IU] | Freq: Three times a day (TID) | SUBCUTANEOUS | Status: DC
  Administered 2015-03-01: 2 [IU] via SUBCUTANEOUS
  Filled 2015-02-28: qty 2

## 2015-02-28 MED ORDER — MONTELUKAST SODIUM 10 MG PO TABS
10.0000 mg | ORAL_TABLET | Freq: Every day | ORAL | Status: DC
  Administered 2015-02-28: 10 mg via ORAL
  Filled 2015-02-28: qty 1

## 2015-02-28 NOTE — ED Notes (Signed)
[facility] this week on 10/26.  Started on Antibiotic and changed nebulizer medication.  Patient c/o chills, dry cough, chest pressure when laying down and taking deep breaths.  Symptoms began on Monday and have worsened today.

## 2015-02-28 NOTE — H&P (Signed)
PCP:   Rory PercyUHL, LAURA J, MD   Chief Complaint:  Shortness of breath  HPI: This is a 59 year old female with history of COPD remote history of tobacco use. She has chronic shortness of breath or wheezing, however, since Monday she developed chills, worsening shortness of breath and chronic wheezing. She denies any fevers. She has a cough that is hacking but nonproductive. She saw her PCP who prescribed doxycycline which she has been taking but she states she does not feel better she feels worse. She came to the ER today. She states she is allergic to prednisone as it gave her myositis and she was unable to walk for months. She is not on home oxygen. In the ER the patient has leukocytosis and chest x-ray positive for pneumonia. The hospitalist have been requested to admit. History provided by the patient and her daughter is present at bedside  Review of Systems:  The patient denies anorexia, fever, weight loss,, vision loss, decreased hearing, hoarseness, chest pain, syncope, dyspnea on exertion, peripheral edema, balance deficits, hemoptysis, abdominal pain, melena, hematochezia, severe indigestion/heartburn, hematuria, incontinence, genital sores, muscle weakness, suspicious skin lesions, transient blindness, difficulty walking, depression, unusual weight change, abnormal bleeding, enlarged lymph nodes, angioedema, and breast masses, shortness of breath  Past Medical History: Past Medical History  Diagnosis Date  . Hypertension   . Hx of gastroesophageal reflux (GERD)   . Reactive airway disease   . History of burning pain in leg   . Asthma   . Atrial fibrillation (HCC)     Brief, in setting of idiopathic pericardial effusion  . Pericardial effusion 07/13    idiopathic , likely viral  . COPD (chronic obstructive pulmonary disease) (HCC)   . Fibromyalgia   . Depressed   . Lymph edema   . Diabetes mellitus without complication [facility](HCC)    Past Surgical History  Procedure Laterality Date  .  Nasal sinus surgery  2010  . Cholecystectomy    . Partial hysterectomy    . Nissen fundoplication    . Cardiac catheterization  2010    [facility]  . Total vaginal hysterectomy      Medications: Prior to Admission medications   Medication Sig Start Date End Date Taking? Authorizing Provider  albuterol (PROVENTIL) (2.5 MG/3ML) 0.083% nebulizer solution Take 2.5 mg by nebulization every 4 (four) hours as needed.   Yes Historical Provider, MD  ASPIR-LOW 81 MG EC tablet Take 1 tablet by mouth daily. 12/31/14  Yes Historical Provider, MD  cetirizine (ZYRTEC) 10 MG tablet Take 10 mg by mouth daily. 12/07/14  Yes Historical Provider, MD  CRESTOR 10 MG tablet Take 1 tablet by mouth daily. 02/24/15  Yes Historical Provider, MD  cyclobenzaprine (FLEXERIL) 10 MG tablet Take 1 tablet by mouth every 8 (eight) hours as needed. 12/29/14  Yes Historical Provider, MD  dexamethasone (DECADRON) 4 MG tablet Take 2 mg by mouth daily with breakfast.   Yes Historical Provider, MD  diltiazem (CARDIZEM CD) 240 MG 24 hr capsule Take 240 mg by mouth daily.   Yes Historical Provider, MD  fluticasone (FLONASE) 50 MCG/ACT nasal spray Place 2 sprays into the nose daily.   Yes Historical Provider, MD  Fluticasone-Salmeterol (ADVAIR DISKUS) 250-50 MCG/DOSE AEPB Inhale 1 puff into the lungs 2 (two) times daily.   Yes Historical Provider, MD  furosemide (LASIX) 20 MG tablet Take 1 tablet by mouth every morning. 01/28/15  Yes Historical Provider, MD  gabapentin (NEURONTIN) 300 MG capsule Take 300 mg by mouth 3 (  three) times daily. 11/24/14  Yes Historical Provider, MD  hydrochlorothiazide (HYDRODIURIL) 25 MG tablet Take 25 mg by mouth daily.   Yes Historical Provider, MD  ibuprofen (ADVIL,MOTRIN) 200 MG tablet Take 400 mg by mouth every 8 (eight) hours as needed.   Yes Historical Provider, MD  ipratropium-albuterol (DUONEB) 0.5-2.5 (3) MG/3ML SOLN Take 3 mLs by nebulization every 6 (six) hours as needed. 02/22/15  Yes Historical Provider,  MD  losartan-hydrochlorothiazide (HYZAAR) 50-12.5 MG tablet Take 1 tablet by mouth daily. 01/28/15  Yes Historical Provider, MD  montelukast (SINGULAIR) 10 MG tablet Take 10 mg by mouth at bedtime.   Yes Historical Provider, MD  pantoprazole (PROTONIX) 40 MG tablet Take 40 mg by mouth daily.   Yes Historical Provider, MD  SYMBICORT 80-4.5 MCG/ACT inhaler Inhale 2 puffs into the lungs 2 (two) times daily. 12/30/14  Yes Historical Provider, MD  TUDORZA PRESSAIR 400 MCG/ACT AEPB Inhale 1 puff into the lungs 2 (two) times daily. 02/23/15  Yes Historical Provider, MD  aspirin 81 MG tablet Take 81 mg by mouth daily.    Historical Provider, MD    Allergies:   Allergies  Allergen Reactions  . Duloxetine Hcl Nausea And Vomiting  . Azithromycin   . Codeine     Vomiting  . Demerol [Meperidine]     Skin feels like it is burning.  . Latex     Peeling skin  . Nucynta [Tapentadol]     Nausea  . Prednisone     Skin gets red.  . Tramadol     vomiting  . Levofloxacin Rash    Painful rash on back    Social History:  reports that she quit smoking about 25 years ago. Her smoking use included Cigarettes. She has a 15 pack-year smoking history. She does not have any smokeless tobacco history on file. She reports that she does not drink alcohol or use illicit drugs.  Family History: Coronary artery disease  Physical Exam: Filed Vitals:   02/28/15 0500 02/28/15 0503 02/28/15 0530 02/28/15 0617  BP: 95/80 132/70 142/73 125/73  Pulse: 88 95 88 86  Temp:    99.8 F (37.7 C)  TempSrc:    Oral  Resp: Height:      Weight:      SpO2: 96% 97% 97% 99%    General:  Alert and oriented times three, well developed and nourished, no acute distress Eyes: PERRLA, pink conjunctiva, no scleral icterus ENT: Moist oral mucosa, neck supple, no thyromegaly Lungs: clear to ascultation, mild wheeze bilateral upper lobes posteriorly, no crackles, no use of accessory muscles Cardiovascular: regular  rate and rhythm, no regurgitation, no gallops, no murmurs. No carotid bruits, no JVD Abdomen: soft, positive BS, non-tender, non-distended, no organomegaly, not an acute abdomen GU: not examined Neuro: CN II - XII grossly intact, sensation intact Musculoskeletal: strength 5/5 all extremities, no clubbing, cyanosis or edema Skin: no rash, no subcutaneous crepitation, no decubitus Psych: appropriate patient   Labs on Admission:   Recent Labs  02/28/15 0200  NA 137  K 3.4*  CL 95*  CO2 32  GLUCOSE 152*  BUN 12  CREATININE 0.86  CALCIUM 9.0    Recent Labs  02/28/15 0200  AST 20  ALT 17  ALKPHOS 113  BILITOT 0.5  PROT 7.4  ALBUMIN 3.6   No results for input(s): LIPASE, AMYLASE in the last 72 hours.  Recent Labs  02/28/15 0200  WBC 13.5*  NEUTROABS 9.1*  HGB 11.0*  HCT 33.0*  MCV 83.5  PLT 516*    Recent Labs  02/28/15 0200  TROPONINI <0.03   Invalid input(s): POCBNP No results for input(s): DDIMER in the last 72 hours. No results for input(s): HGBA1C in the last 72 hours. No results for input(s): CHOL, HDL, LDLCALC, TRIG, CHOLHDL, LDLDIRECT in the last 72 hours. No results for input(s): TSH, T4TOTAL, T3FREE, THYROIDAB in the last 72 hours.  Invalid input(s): FREET3 No results for input(s): VITAMINB12, FOLATE, FERRITIN, TIBC, IRON, RETICCTPCT in the last 72 hours.  Micro Results: No results found for this or any previous visit (from the past 240 hour(s)).   Radiological Exams on Admission: Dg Chest 2 View  02/28/2015  CLINICAL DATA:  Acute onset of cough, dry cough and chest pressure. Initial encounter. EXAM: CHEST  2 VIEW COMPARISON:  Chest radiograph and CTA of the chest performed 06/28/2012 FINDINGS: The lungs are well-aerated. Mild central left upper lobe airspace opacity raises concern for pneumonia. There is no evidence of pleural effusion or pneumothorax. The heart is normal in size; the mediastinal contour is within normal limits. No acute osseous  abnormalities are seen. IMPRESSION: Mild central left upper lobe airspace opacity raises concern for pneumonia. Followup PA and lateral chest X-ray is recommended in 3-4 weeks following trial of antibiotic therapy to ensure resolution and exclude underlying malignancy. Electronically Signed   By: Roanna Raider M.D.   On: 02/28/2015 02:40    Assessment/Plan Present on Admission:  . Community acquired pneumonia COPD -Admit to MedSurg  -Blood cultures collected  -IV Rocephin and doxycycline  -DuoNeb as needed and oxygen to keep sats greater than 88%  -Continue Symbicort  . Atrial fibrillation (HCC)  hypertension Diabetes mellitus -Stable ADA diet, sliding-scale insulin -Home medications resumed  Hailey Phillips 02/28/2015, 6:31 AM

## 2015-02-28 NOTE — Care Management Note (Signed)
Case Management Note  Patient Details  Name: Hailey Phillips MRN: [id number] Date of Birth: 02/26/1956  Subjective/Objective:     59yo Hailey Georgian CoKathy Phillips was admitted 02/28/15 per chills, SOB and wheezing. Dx: Pneumonia. Hx: COPD and numerous other medical issues. Reports that she is uninsured but sees Dr Jonetta SpeakLauren [facility]l at the [facility]. She receives free medication from [facility]. Hailey Phillips reports that she has applied for Medicaid but was denied. Resides at home with her daughter. Has a BSC, a rolling walker, and a shower chair at home. No home oxygen and no home health services. Drives self to appointments. Do not anticipate discharge with home health services. Case management will follow for discharge planning.                Action/Plan:   Expected Discharge Date:  03/02/15               Expected Discharge Plan:     In-House Referral:     Discharge planning Services     Post Acute Care Choice:    Choice offered to:     DME Arranged:    DME Agency:     HH Arranged:    HH Agency:     Status of Service:     Medicare Important Message Given:    Date Medicare IM Given:    Medicare IM give by:    Date Additional Medicare IM Given:    Additional Medicare Important Message give by:     If discussed at Long Length of Stay Meetings, dates discussed:    Additional Comments:  Anmol Fleck A, RN 02/28/2015, 4:35 PM

## 2015-02-28 NOTE — Progress Notes (Signed)
[facility] - [place] [facility]   PATIENT NAME: Hailey Phillips    MR#:  [id number]  DATE OF BIRTH:  [birth year]  SUBJECTIVE:  CHIEF COMPLAINT:   Chief Complaint  Patient presents with  . Cough  . Shortness of Breath   Patient is 59 year old Caucasian female his with history of tobacco abuse, COPD who presents to the hospital with chills, shortness of breath and cough. On arrival to emergency room, she was noted to have leukocytosis, hypotension and her chest x-ray was concerning for pneumonia in the left upper lobe.    Review of Systems  Constitutional: Negative for fever, chills and weight loss.  HENT: Negative for congestion.   Eyes: Negative for blurred vision and double vision.  Respiratory: Positive for cough, shortness of breath and wheezing. Negative for sputum production.   Cardiovascular: Negative for chest pain, palpitations, orthopnea, leg swelling and PND.  Gastrointestinal: Negative for nausea, vomiting, abdominal pain, diarrhea, constipation and blood in stool.  Genitourinary: Negative for dysuria, urgency, frequency and hematuria.  Musculoskeletal: Negative for falls.  Neurological: Negative for dizziness, tremors, focal weakness and headaches.  Endo/Heme/Allergies: Does not bruise/bleed easily.  Psychiatric/Behavioral: Negative for depression. The patient does not have insomnia.     VITAL SIGNS: Blood pressure 135/62, pulse 86, temperature 98.1 F (36.7 C), temperature source Oral, resp. rate 16, height  (1.6 m), weight 113.399 kg (250 lb), SpO2 98 %.  PHYSICAL EXAMINATION:   GENERAL:  59 y.o.-year-old patient lying in the bed in mild-to-moderate distress due to cough, shortness of breath and wheezing.  EYES: Pupils equal, round, reactive to light and accommodation. No scleral icterus. Extraocular muscles intact.  HEENT: Head atraumatic, normocephalic. Oropharynx and nasopharynx clear.  NECK:  Supple, no jugular venous distention.  No thyroid enlargement, no tenderness.  LUNGS: Some diminished breath sounds bilaterally, wheezing with cough anteriorly, no rales,rhonchi or crepitations. No use of accessory muscles of respiration, unless coughing vigorously.  CARDIOVASCULAR: S1, S2 normal. No murmurs, rubs, or gallops.  ABDOMEN: Soft, nontender, nondistended. Bowel sounds present. No organomegaly or mass.  EXTREMITIES: No pedal edema, cyanosis, or clubbing.  NEUROLOGIC: Cranial nerves II through XII are intact. Muscle strength 5/5 in all extremities. Sensation intact. Gait not checked.  PSYCHIATRIC: The patient is alert and oriented x 3.  SKIN: No obvious rash, lesion, or ulcer.   ORDERS/RESULTS REVIEWED:   CBC  Recent Labs Lab 02/28/15 0200  WBC 13.5*  HGB 11.0*  HCT 33.0*  PLT 516*  MCV 83.5  MCH 27.7  MCHC 33.2  RDW 14.0  LYMPHSABS 2.8  MONOABS 1.4*  EOSABS 0.1  BASOSABS 0.1   ------------------------------------------------------------------------------------------------------------------  Chemistries   Recent Labs Lab 02/28/15 0200  NA 137  K 3.4*  CL 95*  CO2 32  GLUCOSE 152*  BUN 12  CREATININE 0.86  CALCIUM 9.0  AST 20  ALT 17  ALKPHOS 113  BILITOT 0.5   ------------------------------------------------------------------------------------------------------------------ estimated creatinine clearance is 87.5 mL/min (by C-G formula based on Cr of 0.86). ------------------------------------------------------------------------------------------------------------------ No results for input(s): TSH, T4TOTAL, T3FREE, THYROIDAB in the last 72 hours.  Invalid input(s): FREET3  Cardiac Enzymes  Recent Labs Lab 02/28/15 0200  TROPONINI <0.03   ------------------------------------------------------------------------------------------------------------------ Invalid input(s):  POCBNP ---------------------------------------------------------------------------------------------------------------  RADIOLOGY: Dg Chest 2 View  02/28/2015  CLINICAL DATA:  Acute onset of cough, dry cough and chest pressure. Initial encounter. EXAM: CHEST  2 VIEW COMPARISON:  Chest radiograph and CTA of the chest performed 06/28/2012 FINDINGS: The lungs are well-aerated. Mild  central left upper lobe airspace opacity raises concern for pneumonia. There is no evidence of pleural effusion or pneumothorax. The heart is normal in size; the mediastinal contour is within normal limits. No acute osseous abnormalities are seen. IMPRESSION: Mild central left upper lobe airspace opacity raises concern for pneumonia. Followup PA and lateral chest X-ray is recommended in 3-4 weeks following trial of antibiotic therapy to ensure resolution and exclude underlying malignancy. Electronically Signed   By: Roanna RaiderJeffery  Chang M.D.   On: 02/28/2015 02:40    EKG:  Orders placed or performed during the hospital encounter of 02/28/15  . EKG 12-Lead  . EKG 12-Lead  . ED EKG  . ED EKG    ASSESSMENT AND PLAN:  Principal Problem:   Community acquired pneumonia Active Problems:   Atrial fibrillation (HCC)   COPD (chronic obstructive pulmonary disease) (HCC)   HTN (hypertension)   Diabetes mellitus (HCC)   Depression   Pneumonia 1. Sepsis due to pneumonia, continue patient on IV fluids. Following patient's blood, sputum culture results, continue doxycycline intravenously 2. COPD exacerbation, continue patient on nebulizers inhalation therapy, antibiotic, follow clinically 3. Community-acquired pneumonia in left upper lobe, get sputum cultures if possible. Add Humibid, continue doxycycline 4. Hypotension. Continue IV fluids 5. Hypokalemia, supplement IV 6. Leukocytosis. Follow with therapy 7. Anemia. Follow-up with rehydration. Get Hemoccult   Management plans discussed with the patient, family and they are in  agreement.   DRUG ALLERGIES:  Allergies  Allergen Reactions  . Duloxetine Hcl Nausea And Vomiting  . Azithromycin   . Codeine     Vomiting  . Demerol [Meperidine]     Skin feels like it is burning.  . Latex     Peeling skin  . Nucynta [Tapentadol]     Nausea  . Prednisone     Skin gets red.  . Tramadol     vomiting  . Levofloxacin Rash    Painful rash on back    CODE STATUS:     Code Status Orders        Start     Ordered   02/28/15 0901  Full code   Continuous     02/28/15 0900      TOTAL TIME TAKING CARE OF THIS PATIENT: 40 minutes.    Katharina CaperVAICKUTE,Ajdin Macke M.D on 02/28/2015 at 12:00 PM  Between 7am to 6pm - Pager - [phone]  After 6pm go to [email or web address] - password [email or web address] [facility]  [facility]  Office  [phone]  CC: Primary care physician; Rory PercyUHL, LAURA J, MD

## 2015-02-28 NOTE — ED Provider Notes (Signed)
[facility] Emergency Department Provider Note  ____________________________________________  Time seen: Approximately 5:15 AM  I have reviewed the triage vital signs and the nursing notes.   HISTORY  Chief Complaint Cough and Shortness of Breath    HPI Hailey Phillips is a 59 y.o. female with a history of COPD who is presenting today with increasing cough and shortness of breath. This past Wednesday she was placed on doxycycline by her primary care doctor in the office. She has since taken 6 doses without any improvement in her symptoms. She says that she feels chest pressure which is worsened with coughing. Denies any productive cough though. Says that she also is having chills at home but no fever.   Past Medical History  Diagnosis Date  . Hypertension   . Hx of gastroesophageal reflux (GERD)   . Reactive airway disease   . History of burning pain in leg   . Asthma   . Atrial fibrillation (HCC)     Brief, in setting of idiopathic pericardial effusion  . Pericardial effusion 07/13    idiopathic , likely viral  . COPD (chronic obstructive pulmonary disease) (HCC)   . Fibromyalgia   . Depressed   . Lymph edema   . Diabetes mellitus without complication [facility](HCC)     Patient Active Problem List   Diagnosis Date Noted  . Atrial fibrillation (HCC)   . Pericardial effusion     Past Surgical History  Procedure Laterality Date  . Nasal sinus surgery  2010  . Cholecystectomy    . Partial hysterectomy    . Nissen fundoplication    . Cardiac catheterization  2010    [facility]  . Total vaginal hysterectomy      Current Outpatient Rx  Name  Route  Sig  Dispense  Refill  . albuterol (PROVENTIL) (2.5 MG/3ML) 0.083% nebulizer solution   Nebulization   Take 2.5 mg by nebulization every 4 (four) hours as needed.         Marland Kitchen. aspirin 81 MG tablet   Oral   Take 81 mg by mouth daily.         Marland Kitchen. dexamethasone (DECADRON) 4 MG tablet   Oral   Take 2 mg by  mouth daily with breakfast.         . diltiazem (CARDIZEM CD) 240 MG 24 hr capsule   Oral   Take 240 mg by mouth daily.         . fluticasone (FLONASE) 50 MCG/ACT nasal spray   Nasal   Place 2 sprays into the nose daily.         . Fluticasone-Salmeterol (ADVAIR DISKUS) 250-50 MCG/DOSE AEPB   Inhalation   Inhale 1 puff into the lungs 2 (two) times daily.         . hydrochlorothiazide (HYDRODIURIL) 25 MG tablet   Oral   Take 25 mg by mouth daily.         Marland Kitchen. ibuprofen (ADVIL,MOTRIN) 200 MG tablet   Oral   Take 400 mg by mouth every 8 (eight) hours as needed.         . montelukast (SINGULAIR) 10 MG tablet   Oral   Take 10 mg by mouth at bedtime.         . pantoprazole (PROTONIX) 40 MG tablet   Oral   Take 40 mg by mouth daily.           Allergies Azithromycin; Codeine; Demerol; Latex; Nucynta; Prednisone; and Tramadol  No  family history on file.  Social History Social History  Substance Use Topics  . Smoking status: Former Smoker -- 1.00 packs/day for 15 years    Types: Cigarettes    Quit date: 10/10/1989  . Smokeless tobacco: None  . Alcohol Use: No    Review of Systems Constitutional: Positive for chills  Eyes: No visual changes. ENT: No sore throat. Cardiovascular: As above  Respiratory: As above  Gastrointestinal: No abdominal pain.  No nausea, no vomiting.  No diarrhea.  No constipation. Genitourinary: Negative for dysuria. Musculoskeletal: Negative for back pain. Skin: Negative for rash. Neurological: Negative for headaches, focal weakness or numbness.  10-point ROS otherwise negative.  ____________________________________________   PHYSICAL EXAM:  VITAL SIGNS: ED Triage Vitals  Enc Vitals Group     BP 02/28/15 0149 134/75 mmHg     Pulse Rate 02/28/15 0149 97     Resp 02/28/15 0149 20     Temp 02/28/15 0149 99.6 F (37.6 C)     Temp Source 02/28/15 0149 Oral     SpO2 02/28/15 0149 95 %     Weight 02/28/15 0149 250 lb  (113.399 kg)     Height 02/28/15 0149  (1.6 m)     Head Cir --      Peak Flow --      Pain Score 02/28/15 0151 8     Pain Loc --      Pain Edu? --      Excl. in GC? --     Constitutional: Alert and oriented. Well appearing and in no acute distress. Eyes: Conjunctivae are normal. PERRL. EOMI. Head: Atraumatic. Nose: No congestion/rhinnorhea. Mouth/Throat: Mucous membranes are moist.  Oropharynx non-erythematous. Neck: No stridor.   Cardiovascular: Normal rate, regular rhythm. Grossly normal heart sounds.  Good peripheral circulation. Respiratory: Normal respiratory effort.  No retractions. Decreased air movement throughout with expiratory cough.  Gastrointestinal: Soft and nontender. No distention. No abdominal bruits. No CVA tenderness. Musculoskeletal: No lower extremity tenderness nor edema.  No joint effusions. Neurologic:  Normal speech and language. No gross focal neurologic deficits are appreciated. No gait instability. Skin:  Skin is warm, dry and intact. No rash noted. Psychiatric: Mood and affect are normal. Speech and behavior are normal.  ____________________________________________   LABS (all labs ordered are listed, but only abnormal results are displayed)  Labs Reviewed  CBC WITH DIFFERENTIAL/PLATELET - Abnormal; Notable for the following:    WBC 13.5 (*)    Hemoglobin 11.0 (*)    HCT 33.0 (*)    Platelets 516 (*)    Neutro Abs 9.1 (*)    Monocytes Absolute 1.4 (*)    All other components within normal limits  COMPREHENSIVE METABOLIC PANEL - Abnormal; Notable for the following:    Potassium 3.4 (*)    Chloride 95 (*)    Glucose, Bld 152 (*)    All other components within normal limits  TROPONIN I   ____________________________________________  EKG  ED ECG REPORT I, Arelia Longest, the attending physician, personally viewed and interpreted this ECG.   Date: 02/28/2015  EKG Time: 1:56 AM  Rate: 93  Rhythm: normal sinus rhythm  Axis:  Rightward axis  Intervals:none  ST&T Change: No ST elevation or depression. No abnormal T-wave inversion. Read as pulmonary disease pattern by the EKG machine.  ____________________________________________  RADIOLOGY  Mild central left upper lobe airspace opacity concerning for pneumonia. ____________________________________________   PROCEDURES    ____________________________________________   INITIAL IMPRESSION / ASSESSMENT AND  PLAN / ED COURSE  Pertinent labs & imaging results that were available during my care of the patient were reviewed by me and considered in my medical decision making (see chart for details).  Patient with cough, shortness of breath borderline fever with chills and failure of outpatient antibiotics. We'll admit to the hospital for treatment for community-acquired pneumonia. I discussed this with the patient as well as her daughter-in-law understand the plan and are willing to comply. We'll also give DuoNebs.  Signed out to Dr. Joneen Roach of the hospitalist service. ____________________________________________   FINAL CLINICAL IMPRESSION(S) / ED DIAGNOSES  Community acquired pneumonia.    Myrna Blazer, MD 02/28/15 [phone]

## 2015-03-01 DIAGNOSIS — I959 Hypotension, unspecified: Secondary | ICD-10-CM

## 2015-03-01 DIAGNOSIS — D649 Anemia, unspecified: Secondary | ICD-10-CM

## 2015-03-01 DIAGNOSIS — D72829 Elevated white blood cell count, unspecified: Secondary | ICD-10-CM

## 2015-03-01 DIAGNOSIS — A419 Sepsis, unspecified organism: Secondary | ICD-10-CM

## 2015-03-01 LAB — HEMOGLOBIN A1C: HEMOGLOBIN A1C: 6.3 % — AB (ref 4.0–6.0)

## 2015-03-01 LAB — CBC
HEMATOCRIT: 34.3 % — AB (ref 35.0–47.0)
HEMOGLOBIN: 11.2 g/dL — AB (ref 12.0–16.0)
MCH: 27.5 pg (ref 26.0–34.0)
MCHC: 32.8 g/dL (ref 32.0–36.0)
MCV: 83.8 fL (ref 80.0–100.0)
PLATELETS: 530 10*3/uL — AB (ref 150–440)
RBC: 4.09 MIL/uL (ref 3.80–5.20)
RDW: 14.2 % (ref 11.5–14.5)
WBC: 11.6 10*3/uL — AB (ref 3.6–11.0)

## 2015-03-01 LAB — BASIC METABOLIC PANEL
ANION GAP: 9 (ref 5–15)
BUN: 14 mg/dL (ref 6–20)
CALCIUM: 9.4 mg/dL (ref 8.9–10.3)
CO2: 33 mmol/L — ABNORMAL HIGH (ref 22–32)
Chloride: 97 mmol/L — ABNORMAL LOW (ref 101–111)
Creatinine, Ser: 0.93 mg/dL (ref 0.44–1.00)
GFR calc Af Amer: >60 mL/min (ref >60)
Glucose, Bld: 142 mg/dL — ABNORMAL HIGH (ref 65–99)
POTASSIUM: 3.6 mmol/L (ref 3.5–5.1)
SODIUM: 139 mmol/L (ref 135–145)

## 2015-03-01 LAB — GLUCOSE, CAPILLARY
GLUCOSE-CAPILLARY: 137 mg/dL — AB (ref 65–99)
Glucose-Capillary: 85 mg/dL (ref 65–99)

## 2015-03-01 MED ORDER — DOXYCYCLINE HYCLATE 100 MG PO CAPS: 100.0000 mg | ORAL_CAPSULE | Freq: Two times a day (BID) | ORAL | Status: AC

## 2015-03-01 MED ORDER — GUAIFENESIN ER 600 MG PO TB12: 600.0000 mg | ORAL_TABLET | Freq: Two times a day (BID) | ORAL | Status: AC

## 2015-03-01 MED ORDER — IPRATROPIUM-ALBUTEROL 0.5-2.5 (3) MG/3ML IN SOLN: 3.0000 mL | RESPIRATORY_TRACT | Status: AC | PRN

## 2015-03-01 MED ORDER — IPRATROPIUM-ALBUTEROL 0.5-2.5 (3) MG/3ML IN SOLN: 3.0000 mL | RESPIRATORY_TRACT | Status: DC | PRN

## 2015-03-01 NOTE — Plan of Care (Signed)
Problem: Consults Goal: Pneumonia Patient Education See Patient Educatio Module for education specifics.  Outcome: Adequate for Discharge Education provided.

## 2015-03-01 NOTE — Care Management (Signed)
Spoke with patient regarding Rx assistance since she is self-pay. She states she is able to [facility]. She is not requiring O2; audible wheezing. She denies RNCM needs. Case closed.

## 2015-03-01 NOTE — Progress Notes (Signed)
Patient able to discharge, pending ride from daughter.  Per MD patient is to finish dose of Doxycycline if she did not take more than 5 days worth previously at home.  Patient advised she only took 3 days worth prior to this admission.  Advised to finish remaining of previously prescribed dose.

## 2015-03-01 NOTE — Discharge Summary (Signed)
[facility] - [place] [facility]   PATIENT NAME: Hailey Phillips    MR#:  [id number]  DATE OF BIRTH:  Sep 26, 1955  DATE OF ADMISSION:  02/28/2015 ADMITTING PHYSICIAN: Gery Pray, MD  DATE OF DISCHARGE: No discharge date for patient encounter.  PRIMARY CARE PHYSICIAN: Rory Percy, MD     ADMISSION DIAGNOSIS:  Community acquired pneumonia [J18.9]  DISCHARGE DIAGNOSIS:  Principal Problem:   Community acquired pneumonia Active Problems:   Pneumonia   Sepsis (HCC)   COPD exacerbation (HCC)   Hypotension   Leukocytosis   Atrial fibrillation (HCC)   HTN (hypertension)   Diabetes mellitus (HCC)   Depression   Anemia   SECONDARY DIAGNOSIS:   Past Medical History  Diagnosis Date  . Hypertension   . Hx of gastroesophageal reflux (GERD)   . Reactive airway disease   . History of burning pain in leg   . Asthma   . Atrial fibrillation (HCC)     Brief, in setting of idiopathic pericardial effusion  . Pericardial effusion 07/13    idiopathic , likely viral  . COPD (chronic obstructive pulmonary disease) (HCC)   . Fibromyalgia   . Depressed   . Lymph edema   . Diabetes mellitus without complication (HCC)     .pro HOSPITAL COURSE:   Patient is 59 year old Caucasian female with history of tobacco abuse, COPD who presents to the hospital with chills, shortness of breath and cough. On arrival to emergency room, she was noted to have leukocytosis, hypotension and her chest x-ray was concerning for pneumonia in the left upper lobe. She was continued on doxycycline, which was prescribed for her. Just prior to coming to the hospital, nebulizing therapy, inhalation therapy, and her condition improved. She was given IV fluids for hypotension. She was felt to be stable to be discharged home to continue her therapy at home Discussion by problem 1. Sepsis due to pneumonia, blood cultures were not taken before antibiotics were initiated, but patient improved  on current antibiotic. Patient is to continue doxycycline to complete a 10 day course, not able to obtain sputum cultures. This patient's cough is nonproductive 2. COPD exacerbation, continue patient on nebulizers inhalation therapy, antibiotic, improved clinically, continue steroids she was taking at home 3. Community-acquired pneumonia in left upper lobe, unable to get sputum cultures, improved on doxycycline, continue Humibid, doxycycline to complete course 4. Hypotension. Resolved on IV fluids, oral intake is satisfactory. Discontinue IV fluids 5. Hypokalemia, supplemented IV 6. Leukocytosis. Improved with therapy 7. Anemia. Stable with rehydration. Hemoccult was ordered but not obtained 8. Tobacco abuse. No recent smoking DISCHARGE CONDITIONS:   Stable  CONSULTS OBTAINED:     DRUG ALLERGIES:   Allergies  Allergen Reactions  . Duloxetine Hcl Nausea And Vomiting  . Azithromycin   . Codeine     Vomiting  . Demerol [Meperidine]     Skin feels like it is burning.  . Latex     Peeling skin  . Nucynta [Tapentadol]     Nausea  . Prednisone     Skin gets red.  . Tramadol     vomiting  . Levofloxacin Rash    Painful rash on back    DISCHARGE MEDICATIONS:   Current Discharge Medication List    START taking these medications   Details  doxycycline (VIBRAMYCIN) 100 MG capsule Take 1 capsule (100 mg total) by mouth 2 (two) times daily. Qty: 10 capsule, Refills: 0    guaiFENesin (MUCINEX) 600 MG 12  hr tablet Take 1 tablet (600 mg total) by mouth 2 (two) times daily. Qty: 20 tablet, Refills: 0    !! ipratropium-albuterol (DUONEB) 0.5-2.5 (3) MG/3ML SOLN Take 3 mLs by nebulization every 4 (four) hours as needed. Qty: 360 mL, Refills: 6     !! - Potential duplicate medications found. Please discuss with provider.    CONTINUE these medications which have NOT CHANGED   Details  albuterol (PROVENTIL) (2.5 MG/3ML) 0.083% nebulizer solution Take 2.5 mg by nebulization every 4  (four) hours as needed.    ASPIR-LOW 81 MG EC tablet Take 1 tablet by mouth daily. Refills: 4    cetirizine (ZYRTEC) 10 MG tablet Take 10 mg by mouth daily. Refills: 1    CRESTOR 10 MG tablet Take 1 tablet by mouth daily. Refills: 3    cyclobenzaprine (FLEXERIL) 10 MG tablet Take 1 tablet by mouth every 8 (eight) hours as needed. Refills: 5    dexamethasone (DECADRON) 4 MG tablet Take 2 mg by mouth daily with breakfast.    fluticasone (FLONASE) 50 MCG/ACT nasal spray Place 2 sprays into the nose daily.    Fluticasone-Salmeterol (ADVAIR DISKUS) 250-50 MCG/DOSE AEPB Inhale 1 puff into the lungs 2 (two) times daily.    furosemide (LASIX) 20 MG tablet Take 1 tablet by mouth every morning. Refills: 3    gabapentin (NEURONTIN) 300 MG capsule Take 300 mg by mouth 3 (three) times daily. Refills: 3    ibuprofen (ADVIL,MOTRIN) 200 MG tablet Take 400 mg by mouth every 8 (eight) hours as needed.    !! ipratropium-albuterol (DUONEB) 0.5-2.5 (3) MG/3ML SOLN Take 3 mLs by nebulization every 6 (six) hours as needed. Refills: 11    losartan-hydrochlorothiazide (HYZAAR) 50-12.5 MG tablet Take 1 tablet by mouth daily. Refills: 4    montelukast (SINGULAIR) 10 MG tablet Take 10 mg by mouth at bedtime.    pantoprazole (PROTONIX) 40 MG tablet Take 40 mg by mouth daily.    SYMBICORT 80-4.5 MCG/ACT inhaler Inhale 2 puffs into the lungs 2 (two) times daily. Refills: 6    TUDORZA PRESSAIR 400 MCG/ACT AEPB Inhale 1 puff into the lungs 2 (two) times daily. Refills: 5     !! - Potential duplicate medications found. Please discuss with provider.    STOP taking these medications     hydrochlorothiazide (HYDRODIURIL) 25 MG tablet      aspirin 81 MG tablet          DISCHARGE INSTRUCTIONS:    Patient is to follow-up with her primary care physician  If you experience worsening of your admission symptoms, develop shortness of breath, life threatening emergency, suicidal or homicidal thoughts  you must seek medical attention immediately by calling 911 or calling your MD immediately  if symptoms less severe.  You Must read complete instructions/literature along with all the possible adverse reactions/side effects for all the Medicines you take and that have been prescribed to you. Take any new Medicines after you have completely understood and accept all the possible adverse reactions/side effects.   Please note  You were cared for by a hospitalist during your hospital stay. If you have any questions about your discharge medications or the care you received while you were in the hospital after you are discharged, you can call the unit and asked to speak with the hospitalist on call if the hospitalist that took care of you is not available. Once you are discharged, your primary care physician will handle any further medical issues. Please note  that NO REFILLS for any discharge medications will be authorized once you are discharged, as it is imperative that you return to your primary care physician (or establish a relationship with a primary care physician if you do not have one) for your aftercare needs so that they can reassess your need for medications and monitor your lab values.    Today   CHIEF COMPLAINT:   Chief Complaint  Patient presents with  . Cough  . Shortness of Breath    HISTORY OF PRESENT ILLNESS:  Hailey Phillips  is a 58 y.o. female with a known history of tobacco abuse, COPD who presents to the hospital with chills, shortness of breath and cough. On arrival to emergency room, she was noted to have leukocytosis, hypotension and her chest x-ray was concerning for pneumonia in the left upper lobe. She was continued on doxycycline, which was prescribed for her. Just prior to coming to the hospital, nebulizing therapy, inhalation therapy, and her condition improved. She was given IV fluids for hypotension. She was felt to be stable to be discharged home to continue her  therapy at home Discussion by problem 1. Sepsis due to pneumonia, blood cultures were not taken before antibiotics were initiated, but patient improved on current antibiotic. Patient is to continue doxycycline to complete a 10 day course, not able to obtain sputum cultures. This patient's cough is nonproductive 2. COPD exacerbation, continue patient on nebulizers inhalation therapy, antibiotic, improved clinically, continue steroids she was taking at home 3. Community-acquired pneumonia in left upper lobe, unable to get sputum cultures, improved on doxycycline, continue Humibid, doxycycline to complete course 4. Hypotension. Resolved on IV fluids, oral intake is satisfactory. Discontinue IV fluids 5. Hypokalemia, supplemented IV 6. Leukocytosis. Improved with therapy 7. Anemia. Stable with rehydration. Hemoccult was ordered but not obtained 8. Tobacco abuse, remote past but no recent smoking  VITAL SIGNS:  Blood pressure 128/70, pulse 109, temperature 98.4 F (36.9 C), temperature source Oral, resp. rate 18, height 5\' 3"  (1.6 m), weight 113.399 kg (250 lb), SpO2 96 %.  I/O:   Intake/Output Summary (Last 24 hours) at 03/01/15 1540 Last data filed at 03/01/15 1300  Gross per 24 hour  Intake   1580 ml  Output      0 ml  Net   1580 ml    PHYSICAL EXAMINATION:  GENERAL:  59 y.o.-year-old patient lying in the bed with no acute distress.  EYES: Pupils equal, round, reactive to light and accommodation. No scleral icterus. Extraocular muscles intact.  HEENT: Head atraumatic, normocephalic. Oropharynx and nasopharynx clear.  NECK:  Supple, no jugular venous distention. No thyroid enlargement, no tenderness.  LUNGS: Normal breath sounds bilaterally, no wheezing, rales,rhonchi or crepitation. No use of accessory muscles of respiration.  CARDIOVASCULAR: S1, S2 normal. No murmurs, rubs, or gallops.  ABDOMEN: Soft, non-tender, non-distended. Bowel sounds present. No organomegaly or mass.   EXTREMITIES: No pedal edema, cyanosis, or clubbing.  NEUROLOGIC: Cranial nerves II through XII are intact. Muscle strength 5/5 in all extremities. Sensation intact. Gait not checked.  PSYCHIATRIC: The patient is alert and oriented x 3.  SKIN: No obvious rash, lesion, or ulcer.   DATA REVIEW:   CBC  Recent Labs Lab 03/01/15 0346  WBC 11.6*  HGB 11.2*  HCT 34.3*  PLT 530*    Chemistries   Recent Labs Lab 02/28/15 0200 03/01/15 0346  NA 137 139  K 3.4* 3.6  CL 95* 97*  CO2 32 33*  GLUCOSE 152* 142*  BUN 12 14  CREATININE 0.86 0.93  CALCIUM 9.0 9.4  AST 20  --   ALT 17  --   ALKPHOS 113  --   BILITOT 0.5  --     Cardiac Enzymes  Recent Labs Lab 02/28/15 0200  TROPONINI <0.03    Microbiology Results  Results for orders placed or performed in visit on 11/20/11  Culture, blood (single)     Status: None   Collection Time: 11/20/11  1:00 PM  Result Value Ref Range Status   Micro Text Report   Final       COMMENT                   NO GROWTH AEROBICALLY/ANAEROBICALLY IN 5 DAYS   ANTIBIOTIC                                                      Culture, blood (single)     Status: None   Collection Time: 11/20/11  1:25 PM  Result Value Ref Range Status   Micro Text Report   Final       COMMENT                   NO GROWTH AEROBICALLY/ANAEROBICALLY IN 5 DAYS   ANTIBIOTIC                                                      Clostridium Difficile by PCR     Status: None   Collection Time: 11/21/11  1:17 PM  Result Value Ref Range Status   Micro Text Report   Final       COMMENT                   NEGATIVE-CLOS.DIFFICILE TOXIN NOT DETECTED BY PCR   ANTIBIOTIC                                                      Body fluid culture     Status: None   Collection Time: 11/22/11  8:50 AM  Result Value Ref Range Status   Micro Text Report   Final       SOURCE: PLEURAL FLUID    COMMENT                   NO GROWTH AEROBICALLY/ANAEROBICALLY IN 4 DAYS   GRAM  STAIN                NO ORGANISMS SEEN   GRAM STAIN                NO WHITE BLOOD CELLS   GRAM STAIN                NO RED BLOOD CELLS   ANTIBIOTIC  Culture, fungus without smear     Status: None   Collection Time: 11/22/11  8:50 AM  Result Value Ref Range Status   Micro Text Report   Final       SOURCE: PLEURAL FLUID    COMMENT                   NO YEAST OR FUNGUS ISOLATED IN 21 DAYS   ANTIBIOTIC                                                      Culture, blood (single)     Status: None   Collection Time: 11/26/11 11:52 AM  Result Value Ref Range Status   Micro Text Report   Final       COMMENT                   NO GROWTH AEROBICALLY/ANAEROBICALLY IN 5 DAYS   ANTIBIOTIC                                                      Culture, blood (single)     Status: None   Collection Time: 11/26/11 11:58 AM  Result Value Ref Range Status   Micro Text Report   Final       COMMENT                   NO GROWTH AEROBICALLY/ANAEROBICALLY IN 5 DAYS   ANTIBIOTIC                                                        RADIOLOGY:  Dg Chest 2 View  02/28/2015  CLINICAL DATA:  Acute onset of cough, dry cough and chest pressure. Initial encounter. EXAM: CHEST  2 VIEW COMPARISON:  Chest radiograph and CTA of the chest performed 06/28/2012 FINDINGS: The lungs are well-aerated. Mild central left upper lobe airspace opacity raises concern for pneumonia. There is no evidence of pleural effusion or pneumothorax. The heart is normal in size; the mediastinal contour is within normal limits. No acute osseous abnormalities are seen. IMPRESSION: Mild central left upper lobe airspace opacity raises concern for pneumonia. Followup PA and lateral chest X-ray is recommended in 3-4 weeks following trial of antibiotic therapy to ensure resolution and exclude underlying malignancy. Electronically Signed   By: Roanna RaiderJeffery  Chang M.D.   On: 02/28/2015 02:40     EKG:   Orders placed or performed during the hospital encounter of 02/28/15  . EKG 12-Lead  . EKG 12-Lead  . ED EKG  . ED EKG      Management plans discussed with the patient, family and they are in agreement.  CODE STATUS:     Code Status Orders        Start     Ordered   02/28/15 0901  Full code   Continuous     02/28/15 0900      TOTAL TIME TAKING CARE OF THIS PATIENT: 40 minutes.  Katharina Caper M.D on 03/01/2015 at 3:40 PM  Between 7am to 6pm - Pager - [phone]  After 6pm go to [email or web address] - password [email or web address]  [facility]  Office  [phone]  CC: Primary care physician; Rory Percy, MD

## 2015-03-01 NOTE — Progress Notes (Signed)
Completed discharge instructions with patient in presence of daughter.  Patient had no complaints or questions about discharge instructions.  Discharge to home.  Provided incentive spirometer for patient to continue breathing exercises.

## 2015-03-22 ENCOUNTER — Ambulatory Visit: Payer: Self-pay | Attending: Oncology | Admitting: *Deleted

## 2015-03-22 ENCOUNTER — Encounter: Payer: Self-pay | Admitting: *Deleted

## 2015-03-22 ENCOUNTER — Ambulatory Visit
Admission: RE | Admit: 2015-03-22 | Discharge: 2015-03-22 | Disposition: A | Discharge status: Home / Self Care | Payer: Self-pay | Source: Ambulatory Visit | Attending: Oncology | Admitting: Oncology

## 2015-03-22 VITALS — BP 152/77 | HR 74 | Temp 98.1°F | Ht 64.17 in | Wt 266.0 lb

## 2015-03-22 DIAGNOSIS — Z0000 Encounter for general adult medical examination without abnormal findings: Secondary | ICD-10-CM

## 2015-03-22 NOTE — Patient Instructions (Signed)
Gave patient hand-out, Women Staying Healthy, Active and Well from [facility], with education on breast health, pap smears, heart and colon health. 

## 2015-03-22 NOTE — Progress Notes (Signed)
Subjective:     Patient ID: Hailey Phillips, female   DOB: 08/13/1955, 59 y.o.   MRN: [id number]  HPI   Review of Systems     Objective:   Physical Exam  Pulmonary/Chest: Right breast exhibits no inverted nipple, no mass, no nipple discharge, no skin change and no tenderness. Left breast exhibits no mass, no nipple discharge, no skin change and no tenderness. Breasts are symmetrical.       Assessment:     59 year old White [facility]  Clinical breast exam unremarkable.  Taught self breast awareness.  Patient points to a tender area at there sternal nipple line at the sternal chondral  / costochondral joint area.  States the "doctor said it was nothing."  Reviewed possible costochondriatitis and her history of fibromyalgia.  If it worsens she is to follow-up with her primary care provider. Clinical breast exam unremarkable.  Taught self breast awareness.  Blood pressure elevated at 152/77.  She is to take her meds as soon as she gets home and is to recheck her blood pressure at [facility] or [facility], and if remains higher than 140/90 she is to follow-up with her primary care provider.  Hand out on hypertention given to patient. Patient has been screened for eligibility.  She does not have any insurance, Medicare or Medicaid.  She also meets financial eligibility.  Hand-out given on the Affordable Care Act.    Plan:     Screening mammogram ordered.  To follow up per [facility] protocol.

## 2015-03-24 ENCOUNTER — Encounter: Payer: Self-pay | Admitting: *Deleted

## 2015-03-24 NOTE — Progress Notes (Signed)
Letter mailed from [facility] to inform patient of her normal mammogram results.  Patient is to follow-up with annual screening in one year.  HSIS to [facility]. 

## 2017-10-15 ENCOUNTER — Other Ambulatory Visit: Payer: Self-pay

## 2017-10-15 ENCOUNTER — Emergency Department: Payer: BLUE CROSS/BLUE SHIELD

## 2017-10-15 ENCOUNTER — Emergency Department
Admission: EM | Admit: 2017-10-15 | Discharge: 2017-10-15 | Disposition: A | Discharge status: Home / Self Care | Payer: BLUE CROSS/BLUE SHIELD | Attending: Emergency Medicine | Admitting: Emergency Medicine

## 2017-10-15 DIAGNOSIS — Z79899 Other long term (current) drug therapy: Secondary | ICD-10-CM | POA: Diagnosis not present

## 2017-10-15 DIAGNOSIS — E119 Type 2 diabetes mellitus without complications: Secondary | ICD-10-CM | POA: Insufficient documentation

## 2017-10-15 DIAGNOSIS — I10 Essential (primary) hypertension: Secondary | ICD-10-CM | POA: Diagnosis not present

## 2017-10-15 DIAGNOSIS — R079 Chest pain, unspecified: Secondary | ICD-10-CM | POA: Diagnosis not present

## 2017-10-15 DIAGNOSIS — Z87891 Personal history of nicotine dependence: Secondary | ICD-10-CM | POA: Diagnosis not present

## 2017-10-15 DIAGNOSIS — J449 Chronic obstructive pulmonary disease, unspecified: Secondary | ICD-10-CM | POA: Insufficient documentation

## 2017-10-15 DIAGNOSIS — R0602 Shortness of breath: Secondary | ICD-10-CM | POA: Diagnosis not present

## 2017-10-15 LAB — CBC
HCT: 36.4 % (ref 35.0–47.0)
HEMOGLOBIN: 12.2 g/dL (ref 12.0–16.0)
MCH: 29 pg (ref 26.0–34.0)
MCHC: 33.4 g/dL (ref 32.0–36.0)
MCV: 86.7 fL (ref 80.0–100.0)
Platelets: 504 10*3/uL — ABNORMAL HIGH (ref 150–440)
RBC: 4.2 MIL/uL (ref 3.80–5.20)
RDW: 14.6 % — AB (ref 11.5–14.5)
WBC: 12.5 10*3/uL — ABNORMAL HIGH (ref 3.6–11.0)

## 2017-10-15 LAB — BLOOD GAS, VENOUS
Acid-Base Excess: 5.6 mmol/L — ABNORMAL HIGH (ref 0.0–2.0)
BICARBONATE: 32.9 mmol/L — AB (ref 20.0–28.0)
Patient temperature: 37
pCO2, Ven: 61 mmHg — ABNORMAL HIGH (ref 44.0–60.0)
pH, Ven: 7.34 (ref 7.250–7.430)
pO2, Ven: <31 mmHg — CL (ref 32.0–45.0)

## 2017-10-15 LAB — BASIC METABOLIC PANEL
ANION GAP: 8 (ref 5–15)
BUN: 17 mg/dL (ref 6–20)
CALCIUM: 8.8 mg/dL — AB (ref 8.9–10.3)
CO2: 29 mmol/L (ref 22–32)
CREATININE: 0.77 mg/dL (ref 0.44–1.00)
Chloride: 104 mmol/L (ref 101–111)
GFR calc Af Amer: >60 mL/min (ref >60)
GFR calc non Af Amer: >60 mL/min (ref >60)
GLUCOSE: 147 mg/dL — AB (ref 65–99)
Potassium: 4.2 mmol/L (ref 3.5–5.1)
Sodium: 141 mmol/L (ref 135–145)

## 2017-10-15 LAB — TROPONIN I

## 2017-10-15 MED ORDER — DEXAMETHASONE SODIUM PHOSPHATE 10 MG/ML IJ SOLN
INTRAMUSCULAR | Status: AC
  Filled 2017-10-15: qty 1

## 2017-10-15 MED ORDER — DEXAMETHASONE 10 MG/ML FOR PEDIATRIC ORAL USE
10.0000 mg | Freq: Once | INTRAMUSCULAR | Status: AC
  Administered 2017-10-15: 10 mg via ORAL

## 2017-10-15 MED ORDER — DEXAMETHASONE 1 MG/ML PO CONC: 10.0000 mg | Freq: Once | ORAL | Status: DC

## 2017-10-15 MED ORDER — IPRATROPIUM-ALBUTEROL 0.5-2.5 (3) MG/3ML IN SOLN
3.0000 mL | Freq: Once | RESPIRATORY_TRACT | Status: AC
  Administered 2017-10-15: 3 mL via RESPIRATORY_TRACT
  Filled 2017-10-15: qty 3

## 2017-10-15 NOTE — Discharge Instructions (Addendum)
Please follow up with your pulmonary physician for further evaluation of your Shortness of breath

## 2017-10-15 NOTE — ED Provider Notes (Signed)
[facility] Emergency Department Provider Note   ____________________________________________   First MD Initiated Contact with Patient 10/15/17 0302     (approximate)  I have reviewed the triage vital signs and the nursing notes.   HISTORY  Chief Complaint Chest Pain and Shortness of Breath    HPI Hailey Phillips is a 62 y.o. female who comes into the hospital today with some trouble breathing.  She reports that she is having more trouble than normal.  She also had some pain across her chest yesterday.  The symptoms started yesterday around 2.  She states that it woke her up and it made her unable to sleep.  She has a history of asthma and COPD.  She has been using her inhalers at home with no relief.  The patient denies any cough but feels tight in her chest.  She also has a CPAP at home and is unsure if there is a blockage or something causing her shortness of breath.  She states that this does not feel like her typical COPD flare.  She denies any fevers.  The patient was concerned and wanted to come in and get checked out.   Past Medical History:  Diagnosis Date  . Asthma   . Atrial fibrillation (HCC)    Brief, in setting of idiopathic pericardial effusion  . COPD (chronic obstructive pulmonary disease) (HCC)   . Depressed   . Diabetes mellitus without complication (HCC)   . Fibromyalgia   . History of burning pain in leg   . Hx of gastroesophageal reflux (GERD)   . Hypertension   . Lymph edema   . Pericardial effusion 07/13   idiopathic , likely viral  . Reactive airway disease     Patient Active Problem List   Diagnosis Date Noted  . Sepsis (HCC) 03/01/2015  . Hypotension 03/01/2015  . Leukocytosis 03/01/2015  . Anemia 03/01/2015  . Community acquired pneumonia 02/28/2015  . COPD exacerbation (HCC) 02/28/2015  . HTN (hypertension) 02/28/2015  . Diabetes mellitus (HCC) 02/28/2015  . Depression 02/28/2015  . Pneumonia 02/28/2015  .  Atrial fibrillation (HCC)   . Pericardial effusion     Past Surgical History:  Procedure Laterality Date  . ABDOMINAL HYSTERECTOMY    . CARDIAC CATHETERIZATION  2010   [facility]  . CHOLECYSTECTOMY    . NASAL SINUS SURGERY  2010  . NISSEN FUNDOPLICATION    . PARTIAL HYSTERECTOMY    . TOTAL VAGINAL HYSTERECTOMY      Prior to Admission medications   Medication Sig Start Date End Date Taking? Authorizing Provider  albuterol (PROVENTIL) (2.5 MG/3ML) 0.083% nebulizer solution Take 2.5 mg by nebulization every 4 (four) hours as needed.    [provider]  ASPIR-LOW 81 MG EC tablet Take 1 tablet by mouth daily. 12/31/14   [provider]  cetirizine (ZYRTEC) 10 MG tablet Take 10 mg by mouth daily. 12/07/14   [provider]  CRESTOR 10 MG tablet Take 1 tablet by mouth daily. 02/24/15   [provider]  cyclobenzaprine (FLEXERIL) 10 MG tablet Take 1 tablet by mouth every 8 (eight) hours as needed. 12/29/14   [provider]  dexamethasone (DECADRON) 4 MG tablet Take 2 mg by mouth daily with breakfast.    [provider]  doxycycline (VIBRAMYCIN) 100 MG capsule Take 1 capsule (100 mg total) by mouth 2 (two) times daily. 03/01/15   Katharina Caper, MD  fluticasone (FLONASE) 50 MCG/ACT nasal spray Place 2 sprays  into the nose daily.    [provider]  Fluticasone-Salmeterol (ADVAIR DISKUS) 250-50 MCG/DOSE AEPB Inhale 1 puff into the lungs 2 (two) times daily.    [provider]  furosemide (LASIX) 20 MG tablet Take 1 tablet by mouth every morning. 01/28/15   [provider]  gabapentin (NEURONTIN) 300 MG capsule Take 300 mg by mouth 3 (three) times daily. 11/24/14   [provider]  guaiFENesin (MUCINEX) 600 MG 12 hr tablet Take 1 tablet (600 mg total) by mouth 2 (two) times daily. 03/01/15   Katharina CaperVaickute, Rima, MD  ibuprofen (ADVIL,MOTRIN) 200 MG tablet Take 400 mg by mouth every 8 (eight) hours as needed.    [provider]  ipratropium-albuterol (DUONEB) 0.5-2.5 (3) MG/3ML SOLN Take 3 mLs by nebulization every 6 (six) hours as needed. 02/22/15   [provider]  ipratropium-albuterol (DUONEB) 0.5-2.5 (3) MG/3ML SOLN Take 3 mLs by nebulization every 4 (four) hours as needed. 03/01/15   Katharina CaperVaickute, Rima, MD  losartan-hydrochlorothiazide (HYZAAR) 50-12.5 MG tablet Take 1 tablet by mouth daily. 01/28/15   [provider]  montelukast (SINGULAIR) 10 MG tablet Take 10 mg by mouth at bedtime.    [provider]  pantoprazole (PROTONIX) 40 MG tablet Take 40 mg by mouth daily.    [provider]  SYMBICORT 80-4.5 MCG/ACT inhaler Inhale 2 puffs into the lungs 2 (two) times daily. 12/30/14   [provider]  TUDORZA PRESSAIR 400 MCG/ACT AEPB Inhale 1 puff into the lungs 2 (two) times daily. 02/23/15   [provider]    Allergies Duloxetine hcl; Azithromycin; Codeine; Demerol [meperidine]; Latex; Nucynta [tapentadol]; Prednisone; Tramadol; and Levofloxacin  No family history on file.  Social History Social History   Tobacco Use  . Smoking status: Former Smoker    Packs/day: 1.00    Years: 15.00    Pack years: 15.00    Types: Cigarettes    Last attempt to quit: 10/10/1989    Years since quitting: 28.0  Substance Use Topics  . Alcohol use: No  . Drug use: No    Review of Systems  Constitutional: No fever/chills Eyes: No visual changes. ENT: No sore throat. Cardiovascular:  chest pain. Respiratory:  shortness of breath. Gastrointestinal: No abdominal pain.   Genitourinary: Negative for dysuria. Musculoskeletal: Negative for back pain. Skin: Negative for rash. Neurological: Negative for headaches   ____________________________________________   PHYSICAL EXAM:  VITAL SIGNS: ED Triage Vitals  Enc Vitals Group     BP 10/15/17 0040 (!) 156/69     Pulse Rate 10/15/17 0040 73     Resp 10/15/17 0040 18     Temp 10/15/17 0040 97.7 F (36.5  C)     Temp Source 10/15/17 0040 Oral     SpO2 10/15/17 0040 99 %     Weight 10/15/17 0040 227 lb (103 kg)     Height 10/15/17 0040 5\' 3"  (1.6 m)     Head Circumference --      Peak Flow --      Pain Score 10/15/17 0056 0     Pain Loc --      Pain Edu? --      Excl. in GC? --     Constitutional: Alert and oriented. Well appearing and in moderate distress. Eyes: Conjunctivae are normal. PERRL. EOMI. Head: Atraumatic. Nose: No congestion/rhinnorhea. Mouth/Throat: Mucous membranes are moist.  Oropharynx non-erythematous. Cardiovascular: Normal rate, regular rhythm. Grossly normal heart sounds.  Good peripheral circulation. Respiratory: Normal respiratory  effort.  No retractions. Mildly diminished breath sounds. Gastrointestinal: Soft and nontender. No distention.  Positive bowel sounds Musculoskeletal: No lower extremity tenderness nor edema.   Neurologic:  Normal speech and language.  Skin:  Skin is warm, dry and intact.  Psychiatric: Mood and affect are normal.   ____________________________________________   LABS (all labs ordered are listed, but only abnormal results are displayed)  Labs Reviewed  BASIC METABOLIC PANEL - Abnormal; Notable for the following components:      Result Value   Glucose, Bld 147 (*)    Calcium 8.8 (*)    All other components within normal limits  CBC - Abnormal; Notable for the following components:   WBC 12.5 (*)    RDW 14.6 (*)    Platelets 504 (*)    All other components within normal limits  BLOOD GAS, VENOUS - Abnormal; Notable for the following components:   pCO2, Ven 61 (*)    pO2, Ven <31.0 (*)    Bicarbonate 32.9 (*)    Acid-Base Excess 5.6 (*)    All other components within normal limits  TROPONIN I  TROPONIN I   ____________________________________________  EKG  ED ECG REPORT I, Rebecka Apley, the attending physician, personally viewed and interpreted this ECG.   Date: 10/15/2017  EKG Time: 0038  Rate: 72   Rhythm: normal sinus rhythm  Axis: normal  Intervals:none  ST&T Change: none  ____________________________________________  RADIOLOGY  ED MD interpretation:  CXR: No acute cardiopulmonary process, Left mid lung zone scarring.  Official radiology report(s): Dg Chest 2 View  Result Date: 10/15/2017 CLINICAL DATA:  Chest pain waking patient.  Shortness of breath. EXAM: CHEST - 2 VIEW COMPARISON:  Chest radiograph February 28, 2015 FINDINGS: Cardiomediastinal silhouette is normal. No pleural effusions or focal consolidations. LEFT mid lung zone scarring. Trachea projects midline and there is no pneumothorax. Soft tissue planes and included osseous structures are non-suspicious. Callus versus projectional artifact at sternum. IMPRESSION: 1. No acute cardiopulmonary process.  LEFT mid lung zone scarring. Electronically Signed   By: Awilda Metro M.D.   On: 10/15/2017 01:46    ____________________________________________   PROCEDURES  Procedure(s) performed: None  Procedures  Critical Care performed: No  ____________________________________________   INITIAL IMPRESSION / ASSESSMENT AND PLAN / ED COURSE  As part of my medical decision making, I reviewed the following data within the electronic MEDICAL RECORD NUMBER Notes from prior ED visits and [place] Controlled Substance Database   This is a 62 year old female who comes into the hospital today with some trouble breathing.  My differential diagnosis includes COPD exacerbation, asthma exacerbation, pneumonia, bronchitis.  I did examine the patient and although she does not have any wheezes she does have some mildly diminished breath sounds.  We check some blood work to include a CBC, BMP and a troponin which were unremarkable.  The patient's chest x-ray showed some scarring but otherwise no other cause of her shortness of breath.  I will give the patient a DuoNeb treatment and check a VBG.  She will be reassessed.  I did reassess the  patient and she states that her breathing is much improved.  I did give the patient a dose of dexamethasone.  She reports that she has breathing treatments at home and she is only visiting from [place].  The patient had a repeat troponin that was negative.  She will be discharged home to follow-up with her primary care physician and pulmonologist when she returns home.  The  patient again states that she feels better and she is ready to be discharged home.  She should return with any worsening symptoms.      ____________________________________________   FINAL CLINICAL IMPRESSION(S) / ED DIAGNOSES  Final diagnoses:  Shortness of breath     ED Discharge Orders    None       Note:  This document was prepared using Dragon voice recognition software and may include unintentional dictation errors.    Rebecka Apley, MD 10/15/17 [phone]

## 2017-10-15 NOTE — ED Notes (Signed)
First nurse note: pt states she has been feeling short of breath since yesterday. Pt with history of copd. Pt is able to speak in full sentences and appears in no acute distress.

## 2017-10-15 NOTE — ED Notes (Signed)
Explanation of turn around time for troponin provided to pt who continues to verbalize concern regarding wait time. Pt states "can't they put a rush on it for me".

## 2017-10-15 NOTE — ED Triage Notes (Signed)
Patient reports woke with mid sternal chest pain that radiated across chest.  Patient reports feeling more short of breath all day Sunday.

## 2018-07-26 IMAGING — CR DG CHEST 2V
2 series · 2 of 2 positions shown · non-contrast
Comparison: Chest radiograph February 28, 2015

CLINICAL DATA: Chest pain waking patient.  Shortness of breath.

EXAM:
CHEST - 2 VIEW

[chest pa]
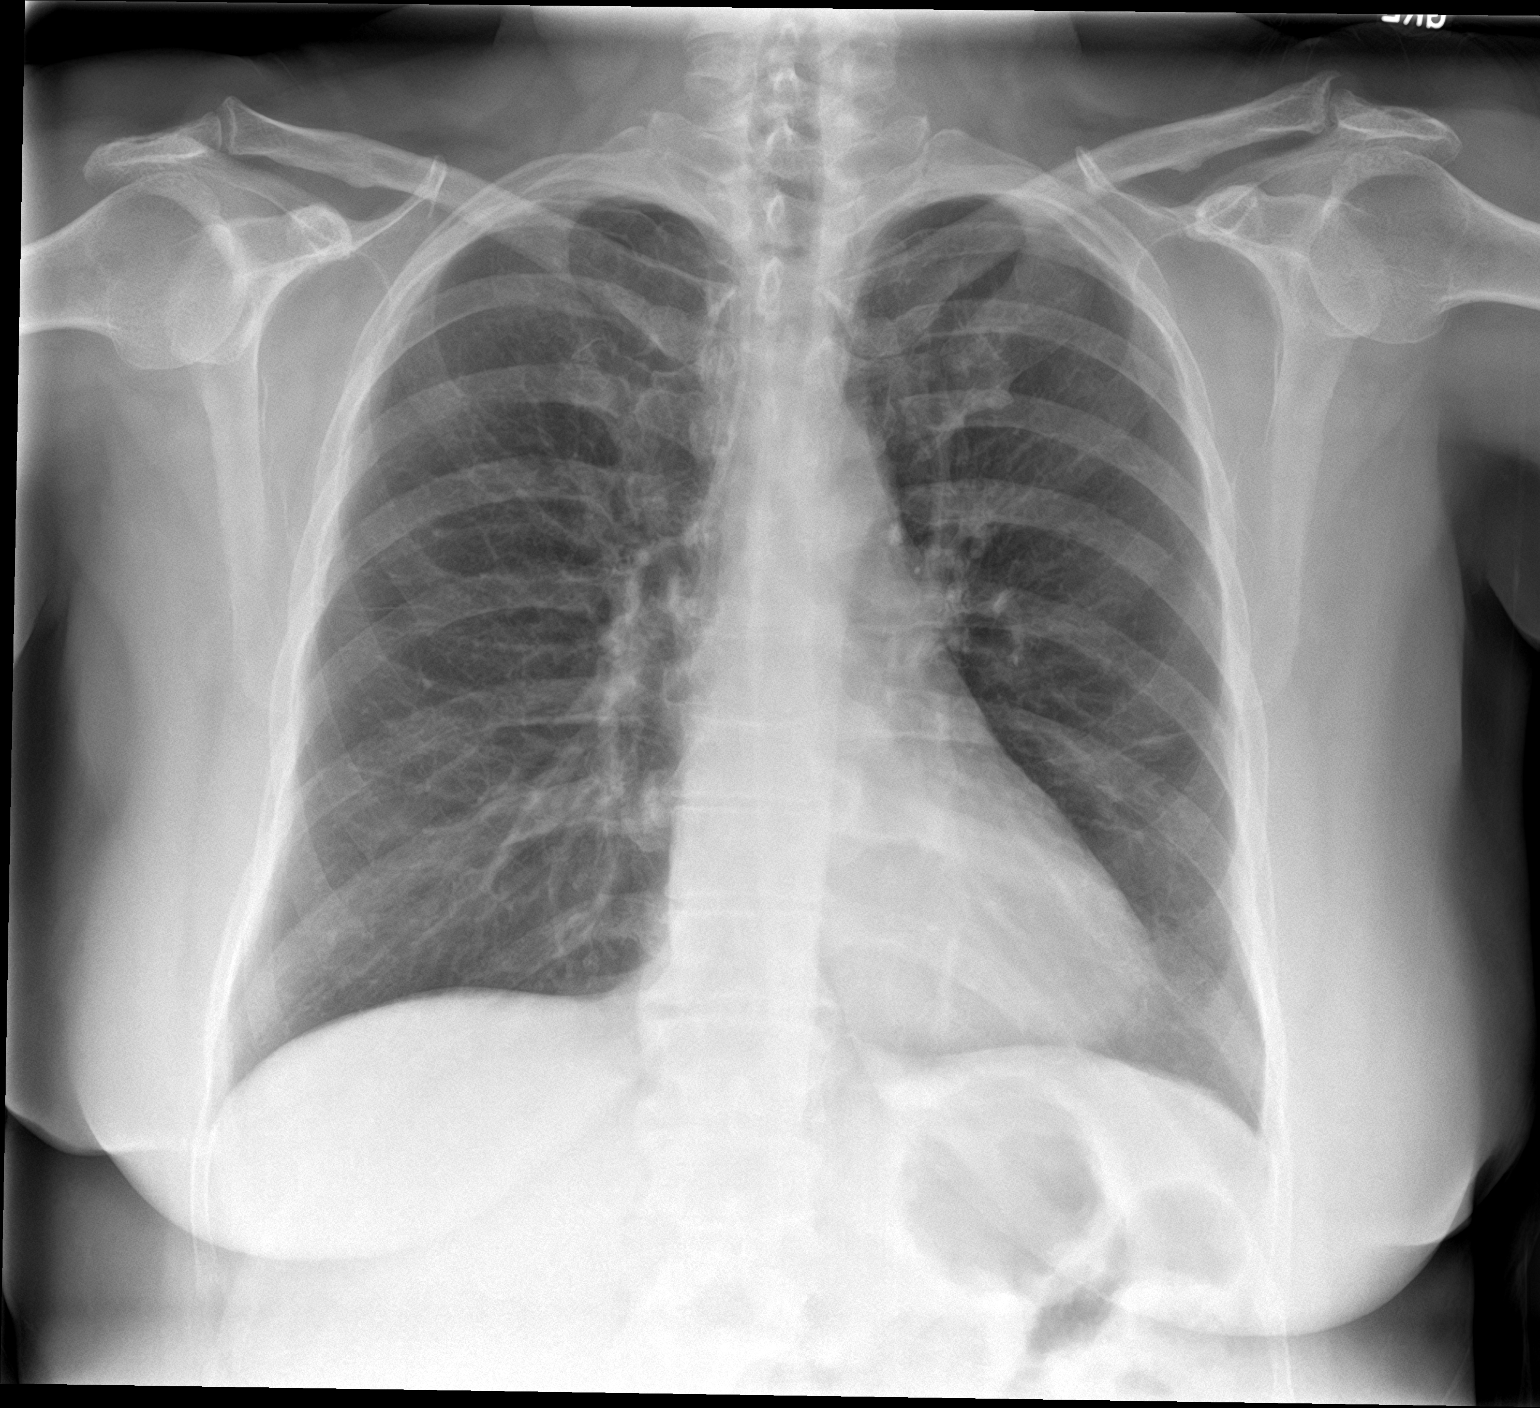

[chest lat]
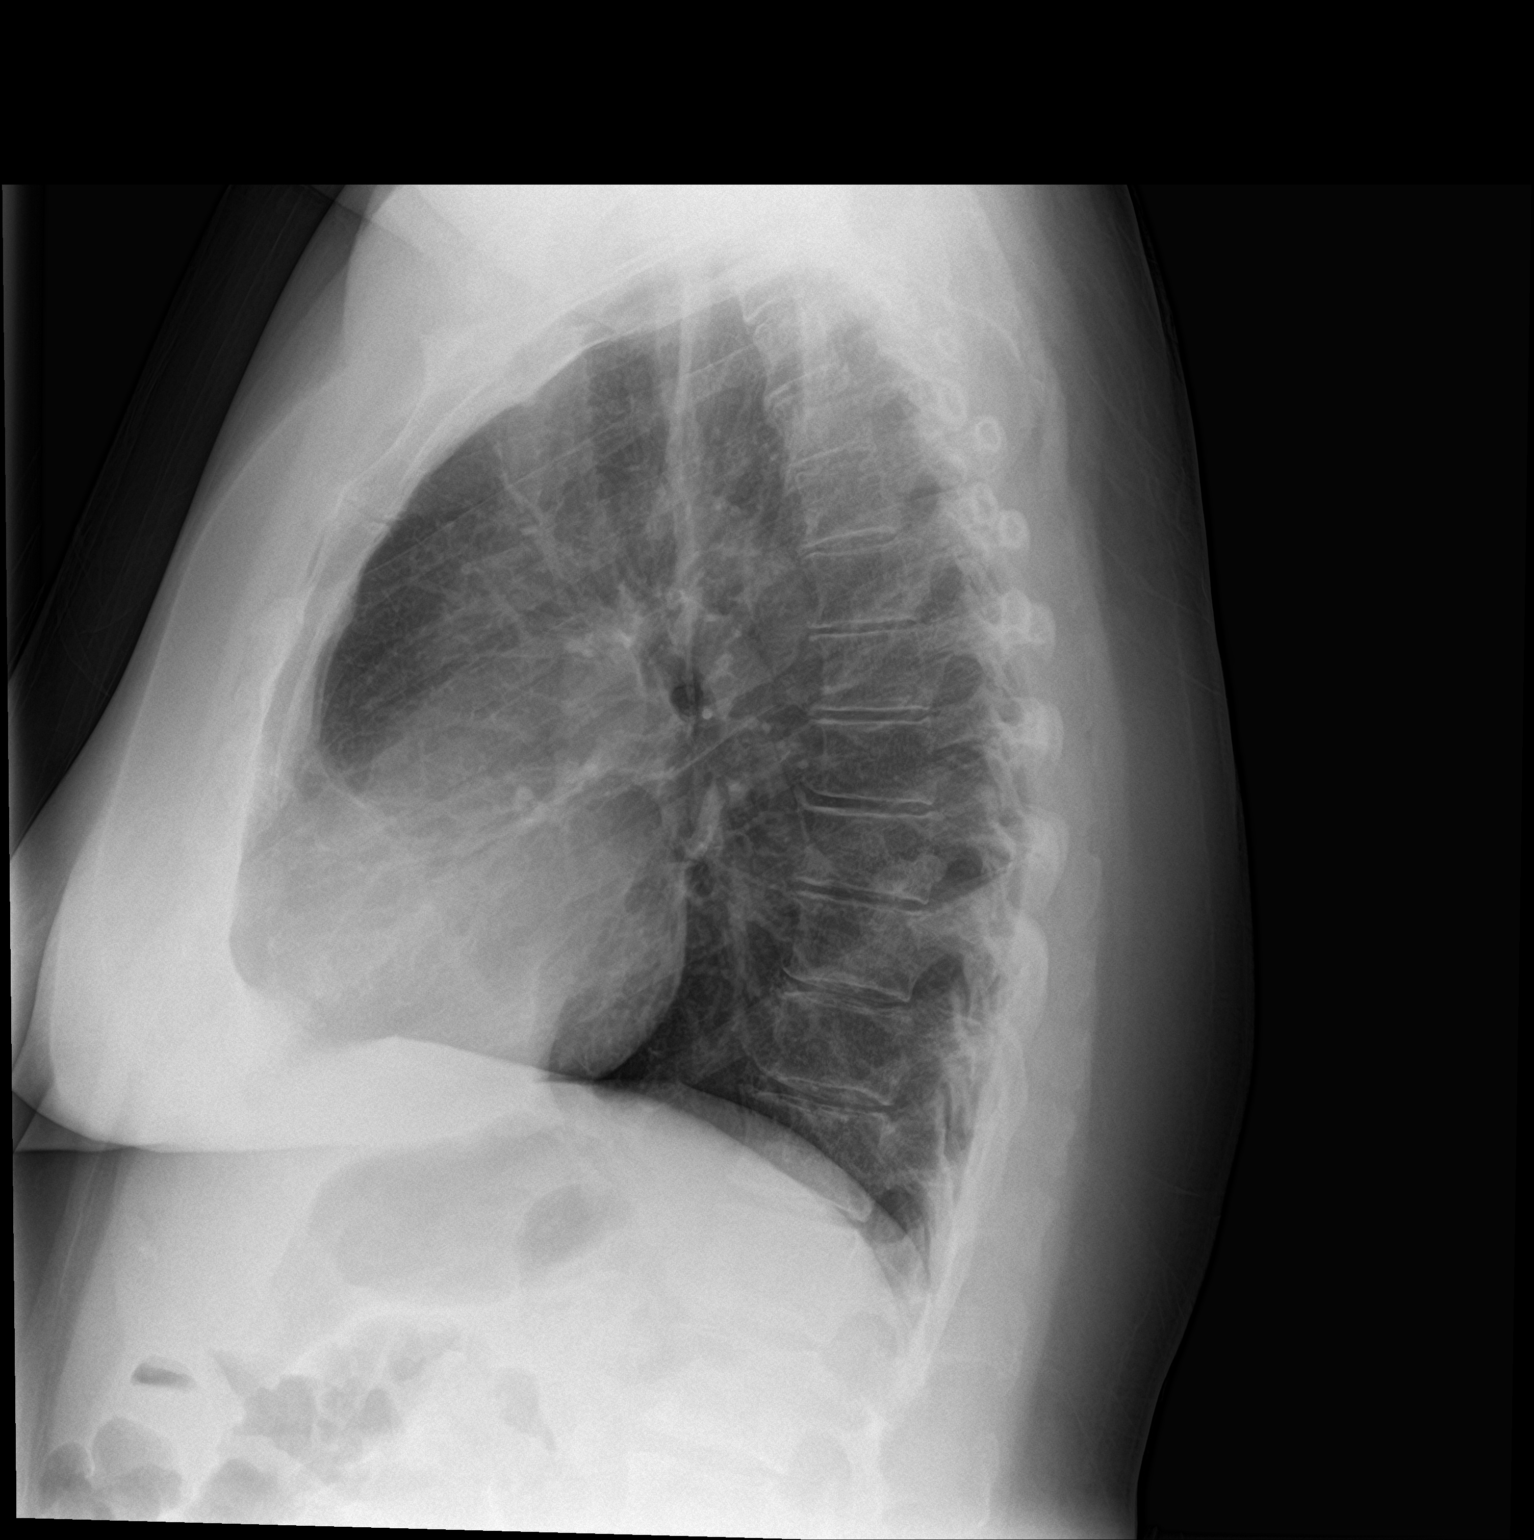

[2 of 2 positions shown; findings below may reference images not displayed]

FINDINGS: Cardiomediastinal silhouette is normal. No pleural effusions or
focal consolidations. LEFT mid lung zone scarring. Trachea projects
midline and there is no pneumothorax. Soft tissue planes and
included osseous structures are non-suspicious. Callus versus
projectional artifact at sternum.
IMPRESSION: 1. No acute cardiopulmonary process.  LEFT mid lung zone scarring.
